# Patient Record
Sex: Female | Born: 1970 | Race: White | Hispanic: No | Marital: Married | Smoking: Never smoker
Health system: Southern US, Community
[De-identification: ages and names within clinical notes are randomized; demographics above are authoritative.]

## PROBLEM LIST (undated history)

## (undated) DIAGNOSIS — N809 Endometriosis, unspecified: Secondary | ICD-10-CM

## (undated) DIAGNOSIS — Z8489 Family history of other specified conditions: Secondary | ICD-10-CM

## (undated) DIAGNOSIS — D369 Benign neoplasm, unspecified site: Secondary | ICD-10-CM

## (undated) HISTORY — PX: WISDOM TOOTH EXTRACTION: SHX21

## (undated) HISTORY — PX: HAND SURGERY: SHX662

## (undated) HISTORY — PX: ABDOMINAL HYSTERECTOMY: SHX81

---

## 2014-07-23 ENCOUNTER — Emergency Department (HOSPITAL_COMMUNITY): Payer: BC Managed Care – PPO

## 2014-07-23 ENCOUNTER — Encounter (HOSPITAL_COMMUNITY): Payer: Self-pay | Admitting: Emergency Medicine

## 2014-07-23 ENCOUNTER — Emergency Department (HOSPITAL_COMMUNITY)
Admission: EM | Admit: 2014-07-23 | Discharge: 2014-07-23 | Disposition: A | Discharge status: Home / Self Care | Payer: BC Managed Care – PPO | Attending: Emergency Medicine | Admitting: Emergency Medicine

## 2014-07-23 DIAGNOSIS — Z86018 Personal history of other benign neoplasm: Secondary | ICD-10-CM | POA: Insufficient documentation

## 2014-07-23 DIAGNOSIS — Z8742 Personal history of other diseases of the female genital tract: Secondary | ICD-10-CM | POA: Insufficient documentation

## 2014-07-23 DIAGNOSIS — Z79899 Other long term (current) drug therapy: Secondary | ICD-10-CM | POA: Diagnosis not present

## 2014-07-23 DIAGNOSIS — R1031 Right lower quadrant pain: Secondary | ICD-10-CM | POA: Diagnosis not present

## 2014-07-23 HISTORY — DX: Benign neoplasm, unspecified site: D36.9

## 2014-07-23 HISTORY — DX: Endometriosis, unspecified: N80.9

## 2014-07-23 LAB — URINALYSIS, ROUTINE W REFLEX MICROSCOPIC
Glucose, UA: NEGATIVE mg/dL
HGB URINE DIPSTICK: NEGATIVE
KETONES UR: 15 mg/dL — AB
Leukocytes, UA: NEGATIVE
Nitrite: NEGATIVE
PROTEIN: NEGATIVE mg/dL
Specific Gravity, Urine: 1.036 — ABNORMAL HIGH (ref 1.005–1.030)
UROBILINOGEN UA: 0.2 mg/dL (ref 0.0–1.0)
pH: 5.5 (ref 5.0–8.0)

## 2014-07-23 LAB — COMPREHENSIVE METABOLIC PANEL
ALBUMIN: 3.8 g/dL (ref 3.5–5.0)
ALK PHOS: 70 U/L (ref 38–126)
ALT: 13 U/L — AB (ref 14–54)
ANION GAP: 7 (ref 5–15)
AST: 15 U/L (ref 15–41)
BUN: 11 mg/dL (ref 6–20)
CALCIUM: 8.8 mg/dL — AB (ref 8.9–10.3)
CO2: 24 mmol/L (ref 22–32)
Chloride: 107 mmol/L (ref 101–111)
Creatinine, Ser: 1 mg/dL (ref 0.44–1.00)
GFR calc Af Amer: >60 mL/min (ref >60)
GFR calc non Af Amer: >60 mL/min (ref >60)
Glucose, Bld: 113 mg/dL — ABNORMAL HIGH (ref 65–99)
Potassium: 3.8 mmol/L (ref 3.5–5.1)
SODIUM: 138 mmol/L (ref 135–145)
TOTAL PROTEIN: 7.4 g/dL (ref 6.5–8.1)
Total Bilirubin: 0.6 mg/dL (ref 0.3–1.2)

## 2014-07-23 LAB — CBC WITH DIFFERENTIAL/PLATELET
Basophils Absolute: 0.1 10*3/uL (ref 0.0–0.1)
Basophils Relative: 1 % (ref 0–1)
EOS ABS: 0.4 10*3/uL (ref 0.0–0.7)
Eosinophils Relative: 4 % (ref 0–5)
HCT: 41.5 % (ref 36.0–46.0)
HEMOGLOBIN: 14.8 g/dL (ref 12.0–15.0)
LYMPHS PCT: 29 % (ref 12–46)
Lymphs Abs: 2.9 10*3/uL (ref 0.7–4.0)
MCH: 31.4 pg (ref 26.0–34.0)
MCHC: 35.7 g/dL (ref 30.0–36.0)
MCV: 87.9 fL (ref 78.0–100.0)
Monocytes Absolute: 0.7 10*3/uL (ref 0.1–1.0)
Monocytes Relative: 7 % (ref 3–12)
NEUTROS PCT: 59 % (ref 43–77)
Neutro Abs: 5.9 10*3/uL (ref 1.7–7.7)
PLATELETS: 285 10*3/uL (ref 150–400)
RBC: 4.72 MIL/uL (ref 3.87–5.11)
RDW: 12.4 % (ref 11.5–15.5)
WBC: 10 10*3/uL (ref 4.0–10.5)

## 2014-07-23 LAB — LIPASE, BLOOD: Lipase: 34 U/L (ref 22–51)

## 2014-07-23 MED ORDER — IOHEXOL 300 MG/ML  SOLN
100.0000 mL | Freq: Once | INTRAMUSCULAR | Status: AC | PRN
  Administered 2014-07-23: 100 mL via INTRAVENOUS

## 2014-07-23 MED ORDER — SODIUM CHLORIDE 0.9 % IV BOLUS (SEPSIS)
1000.0000 mL | Freq: Once | INTRAVENOUS | Status: AC
  Administered 2014-07-23: 1000 mL via INTRAVENOUS

## 2014-07-23 MED ORDER — IOHEXOL 300 MG/ML  SOLN
25.0000 mL | Freq: Once | INTRAMUSCULAR | Status: AC | PRN
  Administered 2014-07-23: 25 mL via ORAL

## 2014-07-23 MED ORDER — TRAMADOL HCL 50 MG PO TABS: 50.0000 mg | ORAL_TABLET | Freq: Four times a day (QID) | ORAL | Status: DC | PRN

## 2014-07-23 MED ORDER — SODIUM CHLORIDE 0.9 % IV BOLUS (SEPSIS): 1000.0000 mL | Freq: Once | INTRAVENOUS | Status: DC

## 2014-07-23 NOTE — ED Notes (Signed)
Dr. Delo at bedside. 

## 2014-07-23 NOTE — ED Notes (Signed)
Pt taken to Ultrasound.

## 2014-07-23 NOTE — ED Provider Notes (Signed)
CSN: [id number]     Arrival date & time 07/23/14  0221 History  This chart was scribed for Veryl Speak, MD by Rayfield Citizen, ED Scribe. This patient was seen in room D32C/D32C and the patient's care was started at 3:16 AM.   Chief Complaint  Patient presents with  . Abdominal Pain   The history is provided by the patient. No language interpreter was used.   HPI Comments: Linda West is an otherwise healthy 44 y.o. female who presents to the Emergency Department complaining of several days of waxing and waning, nonradiating RLQ pain, described as throbbing and burning, worsening acutely around 19:00 on 6/20. Her pain is exacerbated with any form of movement and improves slightly when lying supine and applying pressure. Patient states she took 800mg  motrin at 01:00; she does not want pain medication at this time. She denies dysuria, hematuria, diarrhea, back pain. She denies prior experience with similar symptoms.   She previously had an abdominal hysterectomy but both ovaries remain; patient notes a history of recurrent ovarian cysts.   Past Medical History  Diagnosis Date  . Endometriosis   . Dermoid tumor    Past Surgical History  Procedure Laterality Date  . Abdominal hysterectomy     No family history on file. History  Substance Use Topics  . Smoking status: Never Smoker   . Smokeless tobacco: Not on file  . Alcohol Use: Yes   OB History    No data available     Review of Systems  A complete 10 system review of systems was obtained and all systems are negative except as noted in the HPI and PMH.    Allergies  Codeine and Lodine  Home Medications   Prior to Admission medications   Medication Sig Start Date End Date Taking? Authorizing Provider  cetirizine (ZYRTEC) 10 MG tablet Take 10 mg by mouth daily.   Yes Historical Provider, MD  ibuprofen (ADVIL,MOTRIN) 800 MG tablet Take 800 mg by mouth every 8 (eight) hours as needed for mild pain.   Yes Historical Provider, MD    BP 129/108 mmHg  Pulse 114  Temp(Src) 98.5 F (36.9 C) (Oral)  Resp 14  SpO2 98% Physical Exam  Constitutional: She is oriented to person, place, and time. She appears well-developed and well-nourished.  HENT:  Head: Normocephalic and atraumatic.  Neck: No tracheal deviation present.  Cardiovascular: Normal rate, regular rhythm and normal heart sounds.  Exam reveals no gallop and no friction rub.   No murmur heard. Pulmonary/Chest: Effort normal. No respiratory distress. She has no wheezes. She has no rales.  Abdominal: Soft. There is tenderness (Tender to palpation RLQ). There is no rebound and no guarding.  Musculoskeletal: Normal range of motion. She exhibits no edema.  Neurological: She is alert and oriented to person, place, and time.  Skin: Skin is warm and dry.  Psychiatric: She has a normal mood and affect. Her behavior is normal.  Nursing note and vitals reviewed.   ED Course  Procedures   DIAGNOSTIC STUDIES: Oxygen Saturation is 98% on RA, normal by my interpretation.    COORDINATION OF CARE: 3:23 AM Discussed treatment plan with pt at bedside and pt agreed to plan.   Labs Review Labs Reviewed  URINALYSIS, ROUTINE W REFLEX MICROSCOPIC (NOT AT [facility]) - Abnormal; Notable for the following:    Specific Gravity, Urine 1.036 (*)    Bilirubin Urine SMALL (*)    Ketones, ur 15 (*)    All other components within  normal limits  CBC WITH DIFFERENTIAL/PLATELET  COMPREHENSIVE METABOLIC PANEL  LIPASE, BLOOD    Imaging Review No results found.   EKG Interpretation None      MDM   Final diagnoses:  None    Patient presents with complaints of right lower quadrant pain that has worsened over the past several days. She is tender in the right lower quadrant but no rebound and no guarding. Her workup reveals no elevation of white count, clear urinalysis, and CT and ultrasound that revealed no acute intra-abdominal pathology. Her pain is worse when she moves and sits  forward, and completely resolves when she lies still. It almost seems somewhat musculoskeletal in nature. She will be discharged with ibuprofen and tramadol and when necessary return.   I personally performed the services described in this documentation, which was scribed in my presence. The recorded information has been reviewed and is accurate.      Veryl Speak, MD 07/23/14 [phone]

## 2014-07-23 NOTE — ED Notes (Signed)
Pt. \reports RLQ pain onset Wednesday with nausea and loose stools .

## 2014-07-23 NOTE — Discharge Instructions (Signed)
Ibuprofen 600 mg every 6 hours as needed, and tramadol as prescribed as needed for pain.  Return to the emergency department for worsening pain, high fever, bloody stool, or other new and concerning symptoms.   Abdominal Pain Many things can cause abdominal pain. Usually, abdominal pain is not caused by a disease and will improve without treatment. It can often be observed and treated at home. Your health care provider will do a physical exam and possibly order blood tests and X-rays to help determine the seriousness of your pain. However, in many cases, more time must pass before a clear cause of the pain can be found. Before that point, your health care provider may not know if you need more testing or further treatment. HOME CARE INSTRUCTIONS  Monitor your abdominal pain for any changes. The following actions may help to alleviate any discomfort you are experiencing:  Only take over-the-counter or prescription medicines as directed by your health care provider.  Do not take laxatives unless directed to do so by your health care provider.  Try a clear liquid diet (broth, tea, or water) as directed by your health care provider. Slowly move to a bland diet as tolerated. SEEK MEDICAL CARE IF:  You have unexplained abdominal pain.  You have abdominal pain associated with nausea or diarrhea.  You have pain when you urinate or have a bowel movement.  You experience abdominal pain that wakes you in the night.  You have abdominal pain that is worsened or improved by eating food.  You have abdominal pain that is worsened with eating fatty foods.  You have a fever. SEEK IMMEDIATE MEDICAL CARE IF:   Your pain does not go away within 2 hours.  You keep throwing up (vomiting).  Your pain is felt only in portions of the abdomen, such as the right side or the left lower portion of the abdomen.  You pass bloody or black tarry stools. MAKE SURE YOU:  Understand these instructions.   Will  watch your condition.   Will get help right away if you are not doing well or get worse.  Document Released: 10/28/2004 Document Revised: 01/23/2013 Document Reviewed: 09/27/2012 [facility] Patient Information 2015 [place], [place]. This information is not intended to replace advice given to you by your health care provider. Make sure you discuss any questions you have with your health care provider.

## 2014-07-23 NOTE — ED Notes (Signed)
Discharge instructions and prescription reviewed, voiced understanding. 

## 2015-09-11 NOTE — Progress Notes (Signed)
Cardiology Office Note   Date:  09/12/2015   ID:  Linda West, DOB [birth year], MRN [id number]  PCP:  Doree Albee, MD  Cardiologist:   Minus Breeding, MD   Chief Complaint  Patient presents with  . Abnormal Echo      History of Present Illness: Linda West is a 45 y.o. female who presents for evaluation of an abnormal echo. She had a recent screening study recently and was found to have T-wave inversion on EKG. She subsequently had an echocardiogram which demonstrated an ejection fraction was slightly low at 46% with questionable septal and lateral wall hypokinesis. She also had a cardiopulmonary stress test.  The patient has not had prior cardiac problems. She has had a lot of stress in her life with her oldest son being ill and another child with autism. 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 She says she will get palpitations sometimes at night. She describes her heart is beating fast for several minutes at a time. This may happen occasionally during the day as well. She does not describe chest pressure, neck or arm discomfort. She does get more fatigued than she used to. She doesn't have any new shortness of breath, PND or orthopnea. She has no weight gain or edema. She does have problems with insomnia.   Past Medical History:  Diagnosis Date  . Dermoid tumor   . Endometriosis     Past Surgical History:  Procedure Laterality Date  . ABDOMINAL HYSTERECTOMY       Current Outpatient Prescriptions  Medication Sig Dispense Refill  . cetirizine (ZYRTEC) 10 MG tablet Take 10 mg by mouth daily.    . Cholecalciferol (VITAMIN D3 PO) Take 7,000 Units by mouth.    . clonazePAM (KLONOPIN) 0.5 MG tablet Take 0.5 mg by mouth 2 (two) times daily as needed for anxiety.    Marland Kitchen ibuprofen (ADVIL,MOTRIN) 800 MG tablet Take 800 mg by mouth every 8 (eight) hours as needed for mild pain.    . Melatonin 1 MG TABS Take by mouth daily.    . sertraline  (ZOLOFT) 50 MG tablet Take 50 mg by mouth daily.    Marland Kitchen UNABLE TO FIND daily. Med Name: testosterone 1% 0.5 ML cream     No current facility-administered medications for this visit.     Allergies:   Codeine and Lodine [etodolac]    Social History:  The patient  reports that she has never smoked. She has never used smokeless tobacco. She reports that she drinks alcohol. She reports that she does not use drugs.   Family History:  The patient's family history includes Heart attack in her maternal uncle and paternal uncle; Heart attack (age of onset: 42) in her maternal grandfather; Heart disease in her maternal uncle; Heart failure (age of onset: 81) in her paternal grandmother; Heart murmur in her maternal grandmother; Hypertension in her brother.    ROS:  Please see the history of present illness.   Otherwise, review of systems are positive for none.   All other systems are reviewed and negative.    PHYSICAL EXAM: VS:  BP (!) 128/100   Pulse 96   Ht 5\' 8"  (1.727 m)   Wt 162 lb (73.5 kg)   SpO2 98%   BMI 24.63 kg/m  , BMI Body mass index is 24.63 kg/m. GENERAL:  Well appearing HEENT:  Pupils equal round and reactive, fundi not visualized, oral mucosa unremarkable NECK:  No jugular venous distention, waveform within normal limits, carotid upstroke brisk and symmetric, no bruits, no thyromegaly LYMPHATICS:  No cervical, inguinal adenopathy LUNGS:  Clear to auscultation bilaterally BACK:  No CVA tenderness CHEST:  Unremarkable HEART:  PMI not displaced or sustained,S1 and S2 within normal limits, no S3, no S4, no clicks, no rubs, no murmurs ABD:  Flat, positive bowel sounds normal in frequency in pitch, no bruits, no rebound, no guarding, no midline pulsatile mass, no hepatomegaly, no splenomegaly EXT:  2 plus pulses throughout, no edema, no cyanosis no clubbing SKIN:  No rashes no nodules NEURO:  Cranial nerves II through XII grossly intact, motor grossly intact throughout PSYCH:   Cognitively intact, oriented to person place and time    EKG:  EKG is not ordered today. The ekg ordered today demonstrates sinus rhythm, rate 65, axis within normal limits, RSR prime V1 and V2, diffuse lateral T-wave inversions.   Recent Labs: No results found for requested labs within last 8760 hours.    Lipid Panel No results found for: CHOL, TRIG, HDL, CHOLHDL, VLDL, LDLCALC, LDLDIRECT    Wt Readings from Last 3 Encounters:  09/12/15 162 lb (73.5 kg)      Other studies Reviewed: Additional studies/ records that were reviewed today include: Office records, echo and CXP results. Review of the above records demonstrates:  Please see elsewhere in the note.     ASSESSMENT AND PLAN:  ABNORMAL ECHOCARDIOGRAM:  I would like to review the echo images. Further evaluation of this result will be based on that review.  PALPITATIONS:   Linda West will need a 21 day event monitor.  The patients symptoms necessitate an event monitor.  The symptoms are too infrequent to be identified on a Holter monitor.   I do not see a TSH but I will defer to Doree Albee, MD  ABNORMAL EKG:  I don't strongly suspect obstructive coronary disease or structural heart disease but would like to review the echo and then consider further evaluation such as stress testing or other imaging.  STRESS/INSOMNIA:  We had a long discussion about this and its contribution potentially her symptoms.   Current medicines are reviewed at length with the patient today.  The patient does not have concerns regarding medicines.  The following changes have been made:  no change  Labs/ tests ordered today include:   Orders Placed This Encounter  Procedures  . Cardiac event monitor     Disposition:   FU with me in one month.     Signed, Minus Breeding, MD  09/12/2015 5:25 PM    [facility]

## 2015-09-12 ENCOUNTER — Encounter: Payer: Self-pay | Admitting: *Deleted

## 2015-09-12 ENCOUNTER — Encounter: Payer: Self-pay | Admitting: Cardiology

## 2015-09-12 ENCOUNTER — Ambulatory Visit (INDEPENDENT_AMBULATORY_CARE_PROVIDER_SITE_OTHER): Payer: BC Managed Care – PPO | Admitting: Cardiology

## 2015-09-12 VITALS — BP 128/100 | HR 96 | Ht 68.0 in | Wt 162.0 lb

## 2015-09-12 DIAGNOSIS — R002 Palpitations: Secondary | ICD-10-CM

## 2015-09-12 DIAGNOSIS — R9431 Abnormal electrocardiogram [ECG] [EKG]: Secondary | ICD-10-CM | POA: Diagnosis not present

## 2015-09-12 DIAGNOSIS — R931 Abnormal findings on diagnostic imaging of heart and coronary circulation: Secondary | ICD-10-CM | POA: Diagnosis not present

## 2015-09-12 NOTE — Patient Instructions (Addendum)
Medication Instructions:   Your physician recommends that you continue on your current medications as directed. Please refer to the Current Medication list given to you today.  Labwork: NONE  Testing/Procedures: Your physician has recommended that you wear an event monitor for 21 days. Event monitors are medical devices that record the heart's electrical activity. Doctors most often [place] these monitors to diagnose arrhythmias. Arrhythmias are problems with the speed or rhythm of the heartbeat. The monitor is a small, portable device. You can wear one while you do your normal daily activities. This is usually used to diagnose what is causing palpitations/syncope (passing out). [facility] will send this monitor to you.  Follow-Up:  Your physician recommends that you schedule a follow-up appointment in: 1 month with Dr. Percival Spanish at the [place] office.  Any Other Special Instructions Will Be Listed Below (If Applicable).  If you need a refill on your cardiac medications before your next appointment, please call your pharmacy.

## 2015-09-19 ENCOUNTER — Ambulatory Visit (INDEPENDENT_AMBULATORY_CARE_PROVIDER_SITE_OTHER): Payer: BC Managed Care – PPO

## 2015-09-19 DIAGNOSIS — R002 Palpitations: Secondary | ICD-10-CM

## 2015-09-30 ENCOUNTER — Encounter: Payer: Self-pay | Admitting: Cardiology

## 2015-10-14 NOTE — Progress Notes (Signed)
Cardiology Office Note   Date:  10/15/2015   ID:  Linda West, DOB [birth year], MRN [id number]  PCP:  Doree Albee, MD  Cardiologist:   Minus Breeding, MD   Chief Complaint  Patient presents with  . Palpitations      History of Present Illness: Linda West is a 45 y.o. female who presents for evaluation of an abnormal echo. She had a recent screening study recently and was found to have T-wave inversion on EKG. She subsequently had an echocardiogram which demonstrated an ejection fraction was slightly low at 46% with questionable septal and lateral wall hypokinesis. She also had a cardiopulmonary stress test.  At the last visit she was having palpitations.  However, an event monitor demonstrated normal sinus rhythm with only very rare ectopy. She returns for follow-up.  She's actually been feeling very well. She was having a lot of stress. However, one of her children moved out of the house and has removed quite a bit of stress. She's not having palpitations she was having before. She is exercising she denies any cardiovascular symptoms. She's not been having any chest pressure, neck or arm discomfort. She's not having any shortness of breath, PND or orthopnea. She still has some problems with insomnia.  Past Medical History:  Diagnosis Date  . Dermoid tumor   . Endometriosis     Past Surgical History:  Procedure Laterality Date  . ABDOMINAL HYSTERECTOMY       Current Outpatient Prescriptions  Medication Sig Dispense Refill  . cetirizine (ZYRTEC) 10 MG tablet Take 10 mg by mouth daily.    . Cholecalciferol (VITAMIN D3 PO) Take 7,000 Units by mouth.    . clonazePAM (KLONOPIN) 0.5 MG tablet Take 0.5 mg by mouth at bedtime.     Marland Kitchen ibuprofen (ADVIL,MOTRIN) 800 MG tablet Take 800 mg by mouth every 8 (eight) hours as needed for mild pain.    . Melatonin 1 MG TABS Take by mouth daily.    . sertraline (ZOLOFT) 50 MG tablet Take 50 mg by mouth daily.     No current  facility-administered medications for this visit.     Allergies:   Codeine and Lodine [etodolac]    ROS:  Please see the history of present illness.   Otherwise, review of systems are positive for none.   All other systems are reviewed and negative.    PHYSICAL EXAM: VS:  BP 132/86   Pulse 76   Ht 5' 7.5" (1.715 m)   Wt 164 lb (74.4 kg)   BMI 25.31 kg/m  , BMI Body mass index is 25.31 kg/m. GENERAL:  Well appearing HEENT:  Pupils equal round and reactive, fundi not visualized, oral mucosa unremarkable NECK:  No jugular venous distention, waveform within normal limits, carotid upstroke brisk and symmetric, no bruits, no thyromegaly LYMPHATICS:  No cervical, inguinal adenopathy LUNGS:  Clear to auscultation bilaterally BACK:  No CVA tenderness CHEST:  Unremarkable HEART:  PMI not displaced or sustained,S1 and S2 within normal limits, no S3, no S4, no clicks, no rubs, no murmurs ABD:  Flat, positive bowel sounds normal in frequency in pitch, no bruits, no rebound, no guarding, no midline pulsatile mass, no hepatomegaly, no splenomegaly EXT:  2 plus pulses throughout, no edema, no cyanosis no clubbing SKIN:  No rashes no nodules NEURO:  Cranial nerves II through XII grossly intact, motor grossly intact throughout PSYCH:  Cognitively intact, oriented to person place and time    EKG:  EKG is  ordered today. The ekg ordered today demonstrates sinus rhythm, rate 65, axis within normal limits, RSR prime V1 and V2, T-wave inversion in the anterior leads V1 through V3 much improved compared with previous.   Recent Labs: No results found for requested labs within last 8760 hours.    Lipid Panel No results found for: CHOL, TRIG, HDL, CHOLHDL, VLDL, LDLCALC, LDLDIRECT    Wt Readings from Last 3 Encounters:  10/15/15 164 lb (74.4 kg)  09/12/15 162 lb (73.5 kg)      Other studies Reviewed: Additional studies/ records that were reviewed today include: None Review of the above  records demonstrates:       ASSESSMENT AND PLAN:  ABNORMAL ECHOCARDIOGRAM:  She brought the echo today but I was unable to load this and will look for machine that will allow me to review this. She has no symptoms at this point and the EKG is improved. It is unlikely that further testing will be needed.  PALPITATIONS:   Event monitor demonstrated no arrhythmias. She's not having any palpitations. No change in therapy is indicated. No further testing for dysrhythmia is indicated.  ABNORMAL EKG:  This is improved. As above I will review the echo.  STRESS/INSOMNIA:  We had a long discussion about this and its contribution potentially her symptoms.   Current medicines are reviewed at length with the patient today.  The patient does not have concerns regarding medicines.  The following changes have been made:  none  Labs/ tests ordered today include:   none  Orders Placed This Encounter  Procedures  . EKG 12-Lead     Disposition:   FU with me as needed.  Ronnell Guadalajara, MD  10/15/2015 6:40 PM    [place]

## 2015-10-15 ENCOUNTER — Encounter: Payer: Self-pay | Admitting: Cardiology

## 2015-10-15 ENCOUNTER — Ambulatory Visit (INDEPENDENT_AMBULATORY_CARE_PROVIDER_SITE_OTHER): Payer: BC Managed Care – PPO | Admitting: Cardiology

## 2015-10-15 VITALS — BP 132/86 | HR 76 | Ht 67.5 in | Wt 164.0 lb

## 2015-10-15 DIAGNOSIS — R002 Palpitations: Secondary | ICD-10-CM | POA: Diagnosis not present

## 2015-10-15 NOTE — Patient Instructions (Signed)
Medication Instructions:  The current medical regimen is effective;  continue present plan and medications.  Follow-Up: Follow up as needed with Dr Hochrein.  Thank you for choosing [facility]!!     

## 2015-11-04 ENCOUNTER — Telehealth: Payer: Self-pay | Admitting: Cardiology

## 2015-11-04 NOTE — Telephone Encounter (Signed)
Patient is calling to see if Dr. Percival Spanish has read the results of a recent echo.

## 2015-11-05 NOTE — Telephone Encounter (Signed)
Patient last seen in [place] on 10/15/2015 by Dr. Percival Spanish.  Dictation from that day states that he did not have device to read CD on while there in the office.

## 2015-11-06 NOTE — Telephone Encounter (Signed)
Spoke to patient's husband he stated wife wanted to know if Dr.Hochrein has read echo.Advised Dr.Hochrein out of office.I will send message to him.

## 2015-11-11 NOTE — Telephone Encounter (Signed)
Follow Up:    Calling to see if Dr Warren Lacy have read her ECG yet? Pt is a [facility] pt,Pam is not here today.

## 2015-11-11 NOTE — Telephone Encounter (Signed)
ECG performed dated 9/14 - day after pt seen by Dr. Percival Spanish. This is scanned in to ECG's in chart.  Shows NSR w nonspecific T wave abnormality.  Routed to Dr. Percival Spanish for any recommendations.

## 2015-12-04 NOTE — Telephone Encounter (Signed)
We have tried to load this disc and we cannot get the image to load.  Can you please tell the patient that we are still working on it.  We will try to get a different copy and please work with the previous MD to try to obtain a copy that works.

## 2015-12-05 NOTE — Telephone Encounter (Signed)
Spoke to patient. Aware of delay and issue with CD. I have informed her I would attempt to reach out to Dr. Lanice Shirts office to get this resent. She wanted to make sure they send it directly to [place] and not to her. I will relay this request.  Dr. Lanice Shirts office closed today. Will attempt Monday. Phone is [phone]

## 2015-12-08 NOTE — Telephone Encounter (Signed)
Contacted Dr. Lanice Shirts office and spoke to RN who voiced understanding of request. I confirmed our address that it could be sent to and asked that she call if she needs anything further from [place].

## 2015-12-22 ENCOUNTER — Telehealth: Payer: Self-pay | Admitting: Cardiology

## 2015-12-22 NOTE — Telephone Encounter (Signed)
Returned call - advised should be OK to drop off w [place] office and can have courier pick up.

## 2015-12-22 NOTE — Telephone Encounter (Signed)
New message       Calling to ask Dr Percival Spanish if they can drop off the echo CD to the [place] office and the courier bring it to Dr Percival Spanish or will they need to send it directly to [facility]?

## 2015-12-24 ENCOUNTER — Emergency Department (HOSPITAL_COMMUNITY)
Admission: EM | Admit: 2015-12-24 | Discharge: 2015-12-24 | Disposition: A | Discharge status: Home / Self Care | Payer: BC Managed Care – PPO | Attending: Emergency Medicine | Admitting: Emergency Medicine

## 2015-12-24 ENCOUNTER — Encounter (HOSPITAL_COMMUNITY): Payer: Self-pay | Admitting: Emergency Medicine

## 2015-12-24 ENCOUNTER — Emergency Department (HOSPITAL_COMMUNITY): Payer: BC Managed Care – PPO

## 2015-12-24 DIAGNOSIS — W25XXXA Contact with sharp glass, initial encounter: Secondary | ICD-10-CM | POA: Insufficient documentation

## 2015-12-24 DIAGNOSIS — Y929 Unspecified place or not applicable: Secondary | ICD-10-CM | POA: Insufficient documentation

## 2015-12-24 DIAGNOSIS — Y939 Activity, unspecified: Secondary | ICD-10-CM | POA: Diagnosis not present

## 2015-12-24 DIAGNOSIS — S61212A Laceration without foreign body of right middle finger without damage to nail, initial encounter: Secondary | ICD-10-CM | POA: Diagnosis present

## 2015-12-24 DIAGNOSIS — T07XXXA Unspecified multiple injuries, initial encounter: Secondary | ICD-10-CM

## 2015-12-24 DIAGNOSIS — Y999 Unspecified external cause status: Secondary | ICD-10-CM | POA: Diagnosis not present

## 2015-12-24 MED ORDER — LIDOCAINE HCL (PF) 1 % IJ SOLN
10.0000 mL | Freq: Once | INTRAMUSCULAR | Status: AC
  Administered 2015-12-24: 10 mL via INTRADERMAL
  Filled 2015-12-24: qty 10

## 2015-12-24 MED ORDER — HYDROCODONE-ACETAMINOPHEN 5-325 MG PO TABS: 1.0000 | ORAL_TABLET | ORAL | 0 refills | Status: AC | PRN

## 2015-12-24 MED ORDER — HYDROCODONE-ACETAMINOPHEN 5-325 MG PO TABS
2.0000 | ORAL_TABLET | Freq: Once | ORAL | Status: AC
  Administered 2015-12-24: 2 via ORAL
  Filled 2015-12-24: qty 2

## 2015-12-24 MED ORDER — TETANUS-DIPHTH-ACELL PERTUSSIS 5-2.5-18.5 LF-MCG/0.5 IM SUSP
0.5000 mL | Freq: Once | INTRAMUSCULAR | Status: AC
  Administered 2015-12-24: 0.5 mL via INTRAMUSCULAR
  Filled 2015-12-24: qty 0.5

## 2015-12-24 MED ORDER — CEPHALEXIN 500 MG PO CAPS: 500.0000 mg | ORAL_CAPSULE | Freq: Three times a day (TID) | ORAL | 0 refills | Status: AC

## 2015-12-24 NOTE — ED Triage Notes (Addendum)
Pt cut right hand on glass bowl at home while cleaning. Pt cut middle finger and palm. Pt had syncopal episode in triage in chair due to pain.

## 2015-12-24 NOTE — ED Provider Notes (Addendum)
[facility] DEPT Provider Note   CSN: [id number] Arrival date & time: 12/24/15  1040     History   Chief Complaint Chief Complaint  Patient presents with  . Hand Injury    HPI Linda West is a 45 y.o. female.  HPI  45 year old right-hand dominant female who presents with laceration to the base of her long finger. Patient was using a glass bowl today when it shattered. It broke into several pieces, one of which cut her middle finger. She reports large volume of reportedly pulsatile bleeding. She also endorses mild numbness distal to the wound along the ulnar aspect of her finger. Denies any difficulty moving it, although she has severe, sharp, stabbing pain. Pain is made worse with movement. Denies any alleviating factors. Last tetanus unknown.  Past Medical History:  Diagnosis Date  . Dermoid tumor   . Endometriosis     There are no active problems to display for this patient.   Past Surgical History:  Procedure Laterality Date  . ABDOMINAL HYSTERECTOMY      OB History    No data available       Home Medications    Prior to Admission medications   Medication Sig Start Date End Date Taking? Authorizing Provider  cetirizine (ZYRTEC) 10 MG tablet Take 10 mg by mouth daily.   Yes Historical Provider, MD  Cholecalciferol (VITAMIN D3 PO) Take 7,000 Units by mouth.   Yes Historical Provider, MD  clonazePAM (KLONOPIN) 0.5 MG disintegrating tablet Take 0.5 mg by mouth at bedtime as needed for anxiety. 11/03/15  Yes Historical Provider, MD  ibuprofen (ADVIL,MOTRIN) 800 MG tablet Take 800 mg by mouth every 8 (eight) hours as needed for mild pain.   Yes Historical Provider, MD  Melatonin 1 MG TABS Take by mouth daily.   Yes Historical Provider, MD  sertraline (ZOLOFT) 100 MG tablet Take 100 mg by mouth daily.   Yes Historical Provider, MD  sertraline (ZOLOFT) 50 MG tablet Take 50 mg by mouth daily.   Yes Historical Provider, MD  cephALEXin (KEFLEX) 500 MG capsule Take 1  capsule (500 mg total) by mouth 3 (three) times daily. 12/24/15 12/31/15  Duffy Bruce, MD  clonazePAM (KLONOPIN) 0.5 MG tablet Take 0.5 mg by mouth at bedtime.     Historical Provider, MD  HYDROcodone-acetaminophen (NORCO/VICODIN) 5-325 MG tablet Take 1-2 tablets by mouth every 4 (four) hours as needed for moderate pain or severe pain. 12/24/15   Duffy Bruce, MD    Family History Family History  Problem Relation Age of Onset  . Hypertension Brother   . Heart attack Maternal Uncle   . Heart disease Maternal Uncle   . Heart attack Paternal Uncle   . Heart murmur Maternal Grandmother   . Heart attack Maternal Grandfather 89  . Heart failure Paternal Grandmother 64    Social History Social History  Substance Use Topics  . Smoking status: Never Smoker  . Smokeless tobacco: Never Used  . Alcohol use Yes     Allergies   Codeine and Lodine [etodolac]   Review of Systems Review of Systems  Constitutional: Negative for chills and fever.  Respiratory: Negative for shortness of breath.   Cardiovascular: Negative for chest pain.  Musculoskeletal: Negative for neck pain.  Skin: Negative for rash and wound.  Allergic/Immunologic: Negative for immunocompromised state.  Neurological: Negative for weakness and numbness.  Hematological: Does not bruise/bleed easily.     Physical Exam Updated Vital Signs BP 139/84   Pulse 70  Temp 98.2 F (36.8 C) (Oral)   Resp 16   SpO2 97%   Physical Exam  Constitutional: She is oriented to person, place, and time. She appears well-developed and well-nourished. No distress.  HENT:  Head: Normocephalic and atraumatic.  Eyes: Conjunctivae are normal.  Neck: Neck supple.  Cardiovascular: Normal rate, regular rhythm and normal heart sounds.   Pulmonary/Chest: Effort normal. No respiratory distress. She has no wheezes.  Abdominal: She exhibits no distension.  Musculoskeletal: She exhibits no edema.  Neurological: She is alert and  oriented to person, place, and time. She exhibits normal muscle tone.  Skin: Skin is warm. Capillary refill takes less than 2 seconds. No rash noted.  Nursing note and vitals reviewed.   UPPER EXTREMITY EXAM: RIGHT  INSPECTION & PALPATION: Approx 1.5 cm linear laceration to ulnar aspect of palmar and side of middle finger, with penetration to deep fatty tissue but no exposed bone or joint.  SENSORY: Sensation is intact to light touch in:  Superficial radial nerve distribution (dorsal first web space) Median nerve distribution (tip of index finger)   Ulnar nerve distribution (tip of small finger)    Sensation mildly decreased along ulnar distribution of middle finger distal to injury  MOTOR:  + Motor posterior interosseous nerve (thumb IP extension) + Anterior interosseous nerve (thumb IP flexion, index finger DIP flexion) + Radial nerve (wrist extension) + Median nerve (palpable firing thenar mass) + Ulnar nerve (palpable firing of first dorsal interosseous muscle)  VASCULAR: 2+ radial pulse Brisk capillary refill < 2 sec, fingers warm and well-perfused Good cap refill in middle finger   ED Treatments / Results  Labs (all labs ordered are listed, but only abnormal results are displayed) Labs Reviewed - No data to display  EKG  EKG Interpretation None       Radiology Dg Hand Complete Left  Result Date: 12/24/2015 CLINICAL DATA:  Cut middle finger on left hand. EXAM: LEFT HAND - COMPLETE 3+ VIEW COMPARISON:  None. FINDINGS: Anterior projection radiograph is degraded secondary to overlying pulse oximetry device. No fracture or dislocation with special attention paid to the middle digit. Joint spaces are preserved. No erosions. Regional soft tissues appear normal. No radiopaque foreign body. IMPRESSION: No radiopaque foreign body. Electronically Signed   By: Sandi Mariscal M.D.   On: 12/24/2015 11:53   Dg Hand Complete Right  Result Date: 12/24/2015 CLINICAL DATA:  Cut hand  on glass bowl EXAM: RIGHT HAND - COMPLETE 3+ VIEW COMPARISON:  None. FINDINGS: Frontal, oblique, and lateral views were obtained. There is no fracture or dislocation. Joint spaces appear unremarkable. No erosive change. No radiopaque foreign body. There is a bone island in the capitate bone. IMPRESSION: No fracture or dislocation. No radiopaque foreign body. No appreciable arthropathy. Bone island in the capitate bone. Electronically Signed   By: Lowella Grip III M.D.   On: 12/24/2015 11:53    Procedures .Marland KitchenLaceration Repair Date/Time: 12/25/2015 9:52 AM Performed by: Duffy Bruce Authorized by: Duffy Bruce   Consent:    Consent obtained:  Verbal   Consent given by:  Patient   Risks discussed:  Infection, need for additional repair, nerve damage, poor wound healing, poor cosmetic result, pain, retained foreign body and tendon damage   Alternatives discussed:  No treatment and delayed treatment Anesthesia (see MAR for exact dosages):    Anesthesia method:  Nerve block   Block location:  Digital   Block needle gauge:  27 G   Block anesthetic:  Lidocaine 1%  w/o epi   Block injection procedure:  Anatomic landmarks identified, anatomic landmarks palpated, negative aspiration for blood, introduced needle and incremental injection   Block outcome:  Anesthesia achieved Laceration details:    Location:  Finger   Finger location:  R long finger   Length (cm):  1.5 Repair type:    Repair type:  Simple Pre-procedure details:    Preparation:  Patient was prepped and draped in usual sterile fashion and imaging obtained to evaluate for foreign bodies Exploration:    Hemostasis achieved with:  Direct pressure and tourniquet   Wound exploration: wound explored through full range of motion and entire depth of wound probed and visualized     Wound extent: no fascia violation noted, no muscle damage noted and no tendon damage noted     Contaminated: no   Treatment:    Area cleansed with:   Betadine   Amount of cleaning:  Extensive   Irrigation solution:  Sterile saline   Irrigation volume:  500   Irrigation method:  Pressure wash   Visualized foreign bodies/material removed: no   Skin repair:    Repair method:  Sutures   Suture size:  4-0   Suture material:  Prolene   Suture technique:  Simple interrupted   Number of sutures:  5 Approximation:    Approximation:  Close   Vermilion border: well-aligned   Post-procedure details:    Dressing:  Antibiotic ointment and splint for protection   Patient tolerance of procedure:  Tolerated well, no immediate complications   (including critical care time)  Medications Ordered in ED Medications  HYDROcodone-acetaminophen (NORCO/VICODIN) 5-325 MG per tablet 2 tablet (2 tablets Oral Given 12/24/15 1204)  lidocaine (PF) (XYLOCAINE) 1 % injection 10 mL (10 mLs Intradermal Given 12/24/15 1203)  Tdap (BOOSTRIX) injection 0.5 mL (0.5 mLs Intramuscular Given 12/24/15 1202)     Initial Impression / Assessment and Plan / ED Course  I have reviewed the triage vital signs and the nursing notes.  Pertinent labs & imaging results that were available during my care of the patient were reviewed by me and considered in my medical decision making (see chart for details).  Clinical Course     45 year old right-hand dominant female who presents with laceration to the ulnar aspect of her proximal phalanx of her right middle finger. She does have mild decreased sensation along the ulnar aspect distal this, concerning for digital ulnar nerve injury. She reports pulsatile bleeding is well but distal cap refill is fully intact, radial digital artery is palpable, and I do not suspect vascular compromise. Laceration is lateral to tendon sheaths and she has full ROM at MCP, PIP, and DIP and I do not suspect significant tendon injury or laceration. Laceration closed and repaired as above. Will place the patient on prophylactic Keflex, finger splint, and  refer for outpatient follow-up. Return precautions given.  Final Clinical Impressions(s) / ED Diagnoses   Final diagnoses:  Laceration of right middle finger without foreign body without damage to nail, initial encounter  Abrasions of multiple sites    New Prescriptions Discharge Medication List as of 12/24/2015  1:41 PM    START taking these medications   Details  cephALEXin (KEFLEX) 500 MG capsule Take 1 capsule (500 mg total) by mouth 3 (three) times daily., Starting Wed 12/24/2015, Until Wed 12/31/2015, Print    HYDROcodone-acetaminophen (NORCO/VICODIN) 5-325 MG tablet Take 1-2 tablets by mouth every 4 (four) hours as needed for moderate pain or severe pain., Starting Wed  12/24/2015, Print         Duffy Bruce, MD 12/25/15 [place], MD 12/25/15 [phone]

## 2015-12-24 NOTE — ED Notes (Signed)
Patient transported to X-ray 

## 2016-01-14 ENCOUNTER — Other Ambulatory Visit: Payer: Self-pay | Admitting: Orthopedic Surgery

## 2016-01-15 ENCOUNTER — Encounter (HOSPITAL_BASED_OUTPATIENT_CLINIC_OR_DEPARTMENT_OTHER): Payer: Self-pay | Admitting: Anesthesiology

## 2016-01-15 ENCOUNTER — Encounter (HOSPITAL_BASED_OUTPATIENT_CLINIC_OR_DEPARTMENT_OTHER)
Admission: RE | Disposition: A | Discharge status: Home / Self Care | Payer: Self-pay | Source: Ambulatory Visit | Attending: Orthopedic Surgery

## 2016-01-15 ENCOUNTER — Ambulatory Visit (HOSPITAL_BASED_OUTPATIENT_CLINIC_OR_DEPARTMENT_OTHER): Payer: BC Managed Care – PPO | Admitting: Anesthesiology

## 2016-01-15 ENCOUNTER — Ambulatory Visit (HOSPITAL_BASED_OUTPATIENT_CLINIC_OR_DEPARTMENT_OTHER)
Admission: RE | Admit: 2016-01-15 | Discharge: 2016-01-15 | Disposition: A | Discharge status: Home / Self Care | Payer: BC Managed Care – PPO | Source: Ambulatory Visit | Attending: Orthopedic Surgery | Admitting: Orthopedic Surgery

## 2016-01-15 DIAGNOSIS — F419 Anxiety disorder, unspecified: Secondary | ICD-10-CM | POA: Insufficient documentation

## 2016-01-15 DIAGNOSIS — S64492A Injury of digital nerve of right middle finger, initial encounter: Secondary | ICD-10-CM | POA: Diagnosis not present

## 2016-01-15 DIAGNOSIS — W269XXA Contact with unspecified sharp object(s), initial encounter: Secondary | ICD-10-CM | POA: Diagnosis not present

## 2016-01-15 DIAGNOSIS — S65512A Laceration of blood vessel of right middle finger, initial encounter: Secondary | ICD-10-CM | POA: Insufficient documentation

## 2016-01-15 DIAGNOSIS — S66122A Laceration of flexor muscle, fascia and tendon of right middle finger at wrist and hand level, initial encounter: Secondary | ICD-10-CM | POA: Diagnosis not present

## 2016-01-15 HISTORY — PX: NERVE, TENDON AND ARTERY REPAIR: SHX5695

## 2016-01-15 SURGERY — NERVE, TENDON AND ARTERY REPAIR
Anesthesia: General | Site: Hand | Laterality: Right

## 2016-01-15 MED ORDER — ACETAMINOPHEN 500 MG PO TABS: 1000.0000 mg | ORAL_TABLET | Freq: Once | ORAL | Status: DC

## 2016-01-15 MED ORDER — ONDANSETRON HCL 4 MG/2ML IJ SOLN
INTRAMUSCULAR | Status: AC
  Filled 2016-01-15: qty 2

## 2016-01-15 MED ORDER — ONDANSETRON HCL 4 MG PO TABS: 4.0000 mg | ORAL_TABLET | Freq: Three times a day (TID) | ORAL | 0 refills | Status: AC | PRN

## 2016-01-15 MED ORDER — LIDOCAINE 2% (20 MG/ML) 5 ML SYRINGE
INTRAMUSCULAR | Status: AC
  Filled 2016-01-15: qty 5

## 2016-01-15 MED ORDER — MIDAZOLAM HCL 2 MG/2ML IJ SOLN
INTRAMUSCULAR | Status: AC
  Filled 2016-01-15: qty 2

## 2016-01-15 MED ORDER — PROMETHAZINE HCL 25 MG/ML IJ SOLN: 6.2500 mg | INTRAMUSCULAR | Status: DC | PRN

## 2016-01-15 MED ORDER — LACTATED RINGERS IV SOLN
INTRAVENOUS | Status: DC
  Administered 2016-01-15 (×2): via INTRAVENOUS

## 2016-01-15 MED ORDER — ACETAMINOPHEN 325 MG PO TABS
ORAL_TABLET | ORAL | Status: AC
  Filled 2016-01-15: qty 3

## 2016-01-15 MED ORDER — EPHEDRINE 5 MG/ML INJ
INTRAVENOUS | Status: AC
  Filled 2016-01-15: qty 10

## 2016-01-15 MED ORDER — PROPOFOL 10 MG/ML IV BOLUS
INTRAVENOUS | Status: DC | PRN
  Administered 2016-01-15: 200 mg via INTRAVENOUS

## 2016-01-15 MED ORDER — FENTANYL CITRATE (PF) 100 MCG/2ML IJ SOLN
INTRAMUSCULAR | Status: AC
  Filled 2016-01-15: qty 2

## 2016-01-15 MED ORDER — MIDAZOLAM HCL 2 MG/2ML IJ SOLN
1.0000 mg | INTRAMUSCULAR | Status: DC | PRN
  Administered 2016-01-15: 2 mg via INTRAVENOUS

## 2016-01-15 MED ORDER — FENTANYL CITRATE (PF) 100 MCG/2ML IJ SOLN
25.0000 ug | INTRAMUSCULAR | Status: DC | PRN
  Administered 2016-01-15: 50 ug via INTRAVENOUS

## 2016-01-15 MED ORDER — CEFAZOLIN SODIUM-DEXTROSE 2-4 GM/100ML-% IV SOLN
2.0000 g | INTRAVENOUS | Status: AC
  Administered 2016-01-15: 2 g via INTRAVENOUS

## 2016-01-15 MED ORDER — HYDROCODONE-ACETAMINOPHEN 5-325 MG PO TABS
ORAL_TABLET | ORAL | 0 refills | Status: AC
Start: 2016-01-15 — End: ?

## 2016-01-15 MED ORDER — ACETAMINOPHEN 325 MG PO TABS
650.0000 mg | ORAL_TABLET | Freq: Once | ORAL | Status: AC
  Administered 2016-01-15: 650 mg via ORAL

## 2016-01-15 MED ORDER — LIDOCAINE 2% (20 MG/ML) 5 ML SYRINGE
INTRAMUSCULAR | Status: DC | PRN
  Administered 2016-01-15: 100 mg via INTRAVENOUS

## 2016-01-15 MED ORDER — SCOPOLAMINE 1 MG/3DAYS TD PT72: 1.0000 | MEDICATED_PATCH | Freq: Once | TRANSDERMAL | Status: DC | PRN

## 2016-01-15 MED ORDER — FENTANYL CITRATE (PF) 100 MCG/2ML IJ SOLN
50.0000 ug | INTRAMUSCULAR | Status: DC | PRN
  Administered 2016-01-15: 100 ug via INTRAVENOUS

## 2016-01-15 MED ORDER — EPHEDRINE SULFATE 50 MG/ML IJ SOLN
INTRAMUSCULAR | Status: DC | PRN
  Administered 2016-01-15: 10 mg via INTRAVENOUS

## 2016-01-15 MED ORDER — BUPIVACAINE HCL (PF) 0.25 % IJ SOLN
INTRAMUSCULAR | Status: DC | PRN
  Administered 2016-01-15: 10 mL

## 2016-01-15 MED ORDER — CHLORHEXIDINE GLUCONATE 4 % EX LIQD: 60.0000 mL | Freq: Once | CUTANEOUS | Status: DC

## 2016-01-15 MED ORDER — DEXAMETHASONE SODIUM PHOSPHATE 10 MG/ML IJ SOLN
INTRAMUSCULAR | Status: DC | PRN
  Administered 2016-01-15: 10 mg via INTRAVENOUS

## 2016-01-15 MED ORDER — CEFAZOLIN SODIUM-DEXTROSE 2-4 GM/100ML-% IV SOLN
INTRAVENOUS | Status: AC
  Filled 2016-01-15: qty 100

## 2016-01-15 MED ORDER — ONDANSETRON HCL 4 MG/2ML IJ SOLN
INTRAMUSCULAR | Status: DC | PRN
  Administered 2016-01-15: 4 mg via INTRAVENOUS

## 2016-01-15 SURGICAL SUPPLY — 70 items
BAG DECANTER FOR FLEXI CONT (MISCELLANEOUS) IMPLANT
BANDAGE ACE 3X5.8 VEL STRL LF (GAUZE/BANDAGES/DRESSINGS) ×3 IMPLANT
BLADE MINI RND TIP GREEN BEAV (BLADE) IMPLANT
BLADE SURG 15 STRL LF DISP TIS (BLADE) ×2 IMPLANT
BLADE SURG 15 STRL SS (BLADE) ×4
BNDG ESMARK 4X9 LF (GAUZE/BANDAGES/DRESSINGS) ×3 IMPLANT
BNDG GAUZE ELAST 4 BULKY (GAUZE/BANDAGES/DRESSINGS) ×3 IMPLANT
BRUSH SCRUB EZ PLAIN DRY (MISCELLANEOUS) ×3 IMPLANT
CATH ROBINSON RED A/P 10FR (CATHETERS) IMPLANT
CHLORAPREP W/TINT 26ML (MISCELLANEOUS) ×3 IMPLANT
CONNECTOR NERVE AXOGUARD 2X15 (Orthopedic Implant) ×3 IMPLANT
CORDS BIPOLAR (ELECTRODE) ×3 IMPLANT
COVER BACK TABLE 60X90IN (DRAPES) ×3 IMPLANT
COVER MAYO STAND STRL (DRAPES) ×3 IMPLANT
CUFF TOURNIQUET SINGLE 18IN (TOURNIQUET CUFF) ×3 IMPLANT
DECANTER SPIKE VIAL GLASS SM (MISCELLANEOUS) ×3 IMPLANT
DEPRESSOR TONGUE BLADE STERILE (MISCELLANEOUS) ×3 IMPLANT
DRAPE EXTREMITY T 121X128X90 (DRAPE) ×3 IMPLANT
DRAPE SURG 17X23 STRL (DRAPES) ×3 IMPLANT
GAUZE SPONGE 4X4 12PLY STRL (GAUZE/BANDAGES/DRESSINGS) ×3 IMPLANT
GAUZE XEROFORM 1X8 LF (GAUZE/BANDAGES/DRESSINGS) ×3 IMPLANT
GLOVE BIO SURGEON STRL SZ7.5 (GLOVE) ×3 IMPLANT
GLOVE BIOGEL PI IND STRL 7.0 (GLOVE) ×1 IMPLANT
GLOVE BIOGEL PI IND STRL 7.5 (GLOVE) ×1 IMPLANT
GLOVE BIOGEL PI IND STRL 8 (GLOVE) ×2 IMPLANT
GLOVE BIOGEL PI IND STRL 8.5 (GLOVE) IMPLANT
GLOVE BIOGEL PI INDICATOR 7.0 (GLOVE) ×2
GLOVE BIOGEL PI INDICATOR 7.5 (GLOVE) ×2
GLOVE BIOGEL PI INDICATOR 8 (GLOVE) ×4
GLOVE BIOGEL PI INDICATOR 8.5 (GLOVE)
GLOVE ECLIPSE 6.5 STRL STRAW (GLOVE) ×3 IMPLANT
GLOVE SURG ORTHO 8.0 STRL STRW (GLOVE) IMPLANT
GOWN STRL REUS W/ TWL LRG LVL3 (GOWN DISPOSABLE) ×1 IMPLANT
GOWN STRL REUS W/TWL LRG LVL3 (GOWN DISPOSABLE) ×2
GOWN STRL REUS W/TWL XL LVL3 (GOWN DISPOSABLE) ×3 IMPLANT
GUIDE NRV 3X7RSRB NEURAGEN 2M (Tissue) IMPLANT
LOOP VESSEL MAXI BLUE (MISCELLANEOUS) IMPLANT
NDL SAFETY ECLIPSE 18X1.5 (NEEDLE) IMPLANT
NEEDLE HYPO 18GX1.5 SHARP (NEEDLE)
NEEDLE HYPO 25X1 1.5 SAFETY (NEEDLE) ×3 IMPLANT
NERVE GUIDE NEURAGEN 2MM (Tissue) IMPLANT
NS IRRIG 1000ML POUR BTL (IV SOLUTION) ×3 IMPLANT
PACK BASIN DAY SURGERY FS (CUSTOM PROCEDURE TRAY) ×3 IMPLANT
PAD CAST 3X4 CTTN HI CHSV (CAST SUPPLIES) ×1 IMPLANT
PAD CAST 4YDX4 CTTN HI CHSV (CAST SUPPLIES) IMPLANT
PADDING CAST ABS 4INX4YD NS (CAST SUPPLIES) ×2
PADDING CAST ABS COTTON 4X4 ST (CAST SUPPLIES) ×1 IMPLANT
PADDING CAST COTTON 3X4 STRL (CAST SUPPLIES) ×2
PADDING CAST COTTON 4X4 STRL (CAST SUPPLIES)
SLEEVE SCD COMPRESS KNEE MED (MISCELLANEOUS) ×3 IMPLANT
SPEAR EYE SURG WECK-CEL (MISCELLANEOUS) IMPLANT
SPLINT PLASTER CAST XFAST 3X15 (CAST SUPPLIES) IMPLANT
SPLINT PLASTER XTRA FASTSET 3X (CAST SUPPLIES)
STOCKINETTE 4X48 STRL (DRAPES) ×3 IMPLANT
SUT ETHIBOND 3-0 V-5 (SUTURE) IMPLANT
SUT ETHILON 4 0 PS 2 18 (SUTURE) ×3 IMPLANT
SUT FIBERWIRE 4-0 18 TAPR NDL (SUTURE)
SUT MERSILENE 4 0 P 3 (SUTURE) ×3 IMPLANT
SUT MERSILENE 6 0 P 1 (SUTURE) IMPLANT
SUT NYLON 9 0 VRM6 (SUTURE) ×6 IMPLANT
SUT PROLENE 6 0 P 1 18 (SUTURE) IMPLANT
SUT SILK 4 0 PS 2 (SUTURE) ×3 IMPLANT
SUT SUPRAMID 4-0 (SUTURE) IMPLANT
SUT VICRYL 4-0 PS2 18IN ABS (SUTURE) IMPLANT
SUTURE FIBERWR 4-0 18 TAPR NDL (SUTURE) IMPLANT
SYR BULB 3OZ (MISCELLANEOUS) ×3 IMPLANT
SYR CONTROL 10ML LL (SYRINGE) ×3 IMPLANT
TOWEL OR 17X24 6PK STRL BLUE (TOWEL DISPOSABLE) ×6 IMPLANT
TRAY DSU PREP LF (CUSTOM PROCEDURE TRAY) IMPLANT
UNDERPAD 30X30 (UNDERPADS AND DIAPERS) ×3 IMPLANT

## 2016-01-15 NOTE — Brief Op Note (Signed)
01/15/2016  4:45 PM  PATIENT:  Linda West  45 y.o. female  PRE-OPERATIVE DIAGNOSIS:  right long finger nerve laceration  POST-OPERATIVE DIAGNOSIS:  right long finger digital nerve and artery laceration, FDP zone 2 laceration  PROCEDURE:  Procedure(s): NERVE, TENDON AND ARTERY right long finger REPAIR as necessary (Right)  SURGEON:  Surgeon(s) and Role:    * Leanora Cover, MD - Primary  PHYSICIAN ASSISTANT:   ASSISTANTS: none   ANESTHESIA:   general  EBL:  Total I/O In: 1000 [I.V.:1000] Out: 7 [Blood:7]  BLOOD ADMINISTERED:none  DRAINS: none   LOCAL MEDICATIONS USED:  MARCAINE     SPECIMEN:  No Specimen  DISPOSITION OF SPECIMEN:  N/A  COUNTS:  YES  TOURNIQUET:   Total Tourniquet Time Documented: Upper Arm (Right) - 81 minutes Total: Upper Arm (Right) - 81 minutes   DICTATION: .Other Dictation: Dictation Number [id number]  PLAN OF CARE: Discharge to home after PACU  PATIENT DISPOSITION:  PACU - hemodynamically stable.

## 2016-01-15 NOTE — Op Note (Signed)
[id number] 

## 2016-01-15 NOTE — Discharge Instructions (Addendum)

## 2016-01-15 NOTE — H&P (Signed)
  Linda West is an 45 y.o. female.   Chief Complaint: right long finger digital nerve laceration HPI: 45 yo rhd female states she lacerated right long finger when a bowl she was holding broke.  Seen at ED where wound sutured.  Continued numbness on ulnar side of digit and electrical type pain.  She wishes to undergo exploration with repair of tendon/artery/nerve as necessary.  Allergies:  Allergies  Allergen Reactions  . Codeine Nausea And Vomiting  . Lodine [Etodolac] Nausea And Vomiting    Past Medical History:  Diagnosis Date  . Dermoid tumor   . Endometriosis     Past Surgical History:  Procedure Laterality Date  . ABDOMINAL HYSTERECTOMY      Family History: Family History  Problem Relation Age of Onset  . Hypertension Brother   . Heart attack Maternal Uncle   . Heart disease Maternal Uncle   . Heart attack Paternal Uncle   . Heart murmur Maternal Grandmother   . Heart attack Maternal Grandfather 89  . Heart failure Paternal Grandmother 64    Social History:   reports that she has never smoked. She has never used smokeless tobacco. She reports that she drinks alcohol. She reports that she does not use drugs.  Medications: No prescriptions prior to admission.    No results found for this or any previous visit (from the past 48 hour(s)).  No results found.   A comprehensive review of systems was negative.  There were no vitals taken for this visit.  General appearance: alert Head: Normocephalic, without obvious abnormality, atraumatic Neck: supple, symmetrical, trachea midline Resp: clear to auscultation bilaterally Cardio: regular rate and rhythm GI: non-tender Extremities: Intact sensation and capillary refill all digits.  +epl/fpl/io.  No wounds.  Pulses: 2+ and symmetric Skin: Skin color, texture, turgor normal. No rashes or lesions Neurologic: Grossly normal Incision/Wound:none  Assessment/Plan Right long finger laceration with nerve  laceration.  Plan exploration with repair of tendon/artery/nerve as necessary.  Risks, benefits, and alternatives of surgery were discussed and the patient agrees with the plan of care.   Tausha Milhoan R 01/15/2016, 9:49 AM

## 2016-01-15 NOTE — Transfer of Care (Signed)
Immediate Anesthesia Transfer of Care Note  Patient: Linda West  Procedure(s) Performed: Procedure(s): NERVE, TENDON AND ARTERY right long finger REPAIR as necessary (Right)  Patient Location: PACU  Anesthesia Type:General  Level of Consciousness: awake  Airway & Oxygen Therapy: Patient Spontanous Breathing and Patient connected to face mask oxygen  Post-op Assessment: Report given to RN and Post -op Vital signs reviewed and stable  Post vital signs: Reviewed and stable  Last Vitals:  Vitals:   01/15/16 1321 01/15/16 1648  BP: (!) 151/84   Pulse: 74 (!) 119  Resp: 20 (!) 22  Temp: 36.8 C     Last Pain:  Vitals:   01/15/16 1321  TempSrc: Oral         Complications: No apparent anesthesia complications

## 2016-01-15 NOTE — Anesthesia Postprocedure Evaluation (Signed)
Anesthesia Post Note  Patient: Linda West  Procedure(s) Performed: Procedure(s) (LRB): NERVE, TENDON AND ARTERY right long finger REPAIR as necessary (Right)  Patient location during evaluation: PACU Anesthesia Type: General Level of consciousness: awake and alert Pain management: pain level controlled Vital Signs Assessment: post-procedure vital signs reviewed and stable Respiratory status: spontaneous breathing, nonlabored ventilation, respiratory function stable and patient connected to nasal cannula oxygen Cardiovascular status: blood pressure returned to baseline and stable Postop Assessment: no signs of nausea or vomiting Anesthetic complications: no    Last Vitals:  Vitals:   01/15/16 1715 01/15/16 1730  BP: (!) 133/93 126/87  Pulse: (!) 116 (!) 101  Resp: 12 10  Temp:      Last Pain:  Vitals:   01/15/16 1730  TempSrc:   PainSc: 3                  Catalina Gravel

## 2016-01-15 NOTE — Anesthesia Procedure Notes (Signed)
Procedure Name: LMA Insertion Date/Time: 01/15/2016 3:08 PM Performed by: Lieutenant Diego Pre-anesthesia Checklist: Patient identified, Emergency Drugs available, Suction available and Patient being monitored Patient Re-evaluated:Patient Re-evaluated prior to inductionOxygen Delivery Method: Circle system utilized Preoxygenation: Pre-oxygenation with 100% oxygen Intubation Type: IV induction Ventilation: Mask ventilation without difficulty LMA: LMA inserted LMA Size: 4.0 Number of attempts: 1 Airway Equipment and Method: Bite block Placement Confirmation: positive ETCO2 and breath sounds checked- equal and bilateral Tube secured with: Tape Dental Injury: Teeth and Oropharynx as per pre-operative assessment

## 2016-01-15 NOTE — Anesthesia Preprocedure Evaluation (Addendum)
Anesthesia Evaluation  Patient identified by MRN, date of birth, ID band Patient awake    Reviewed: Allergy & Precautions, NPO status , Patient's Chart, lab work & pertinent test results  History of Anesthesia Complications Negative for: history of anesthetic complications  Airway Mallampati: I  TM Distance: <3 FB Neck ROM: Full    Dental  (+) Teeth Intact, Dental Advisory Given   Pulmonary asthma ,    Pulmonary exam normal breath sounds clear to auscultation       Cardiovascular Exercise Tolerance: Good negative cardio ROS Normal cardiovascular exam Rhythm:Regular Rate:Normal     Neuro/Psych PSYCHIATRIC DISORDERS Anxiety negative neurological ROS     GI/Hepatic negative GI ROS, Neg liver ROS,   Endo/Other  negative endocrine ROS  Renal/GU negative Renal ROS     Musculoskeletal negative musculoskeletal ROS (+)   Abdominal   Peds  Hematology negative hematology ROS (+)   Anesthesia Other Findings Day of surgery medications reviewed with the patient.  Reproductive/Obstetrics negative OB ROS Endometriosis                            Anesthesia Physical Anesthesia Plan  ASA: II  Anesthesia Plan: General   Post-op Pain Management:    Induction: Intravenous  Airway Management Planned: LMA  Additional Equipment:   Intra-op Plan:   Post-operative Plan: Extubation in OR  Informed Consent: I have reviewed the patients History and Physical, chart, labs and discussed the procedure including the risks, benefits and alternatives for the proposed anesthesia with the patient or authorized representative who has indicated his/her understanding and acceptance.   Dental advisory given  Plan Discussed with: CRNA  Anesthesia Plan Comments: (Risks/benefits of general anesthesia discussed with patient including risk of damage to teeth, lips, gum, and tongue, nausea/vomiting, allergic reactions  to medications, and the possibility of heart attack, stroke and death.  All patient questions answered.  Patient wishes to proceed.)        Anesthesia Quick Evaluation

## 2016-01-16 ENCOUNTER — Encounter (HOSPITAL_BASED_OUTPATIENT_CLINIC_OR_DEPARTMENT_OTHER): Payer: Self-pay | Admitting: Orthopedic Surgery

## 2016-01-16 NOTE — Op Note (Signed)
NAME:  Linda West, Linda West NO.:  [id number]  MEDICAL RECORD NO.:  [id number]  LOCATION:                                 FACILITY:  PHYSICIAN:  Leanora Cover, MD        DATE OF BIRTH:  [birth year]  DATE OF PROCEDURE:  01/15/2016 DATE OF DISCHARGE:                              OPERATIVE REPORT   PREOPERATIVE DIAGNOSIS:  Right long finger nerve laceration.  POSTOPERATIVE DIAGNOSES:  Right long finger ulnar digital nerve and ulnar digital artery laceration and FDP zone II partial laceration.  PROCEDURE:   1. Right long finger exploration with repair of zone II FDP tendon laceration 2. Right long finger repair of ulnar digital nerve with synthetic conduit under microscope 3. Right long finger repair of ulnar digital artery under microscope.  SURGEON:  Leanora Cover, MD.  ASSISTANT:  None.  ANESTHESIA:  General.  IV FLUIDS:  Per anesthesia flow sheet.  ESTIMATED BLOOD LOSS:  Minimal.  COMPLICATIONS:  None.  SPECIMENS:  None.  TOURNIQUET TIME:  81 minutes.  DISPOSITION:  Stable to PACU.  INDICATIONS:  Linda West is a 45 year old female who states she lacerated the right long finger when a bowl she was holding broke approximately 3 weeks ago.  She was seen at the emergency department where the wound was irrigated and sutured closed.  She has had continued pain in the finger as well as numbness.  I recommended operative exploration with repair of tendon, artery, and nerve as necessary. Risks, benefits, and alternatives of surgery were discussed including the risk of blood loss; infection; damage to nerves, vessels, tendons, ligaments, bone; failure of surgery; need for additional surgery; complications with wound healing; continued pain; and stiffness.  She voiced understanding of these risks and elected to proceed.  OPERATIVE COURSE:  After being identified preoperatively by myself, the patient and I agreed upon procedure and site of procedure.  Surgical site  was marked.  Risks, benefits, and alternatives of surgery were reviewed and she wished to proceed.  Surgical consent had been signed. She was given IV Ancef as preoperative antibiotic prophylaxis.  She was transported to the operating room and placed on the operating room table in supine position with the right upper extremity on arm board.  General anesthesia induced by anesthesiologist.  The right upper extremity was prepped and draped in normal sterile orthopedic fashion.  A surgical pause was performed between surgeons, anesthesia, and operating room staff, and all were in agreement as to the patient, procedure, and site of procedure.  Tourniquet at the proximal aspect of the extremity was inflated to 250 mmHg after exsanguination of the limb with an Esmarch bandage.  The finger was incised in a Brunner-type fashion including the traumatic portion of the wound.  This was carried in subcutaneous tissues by spreading technique.  The ulnar digital nerve was readily found and was noted to be 100% lacerated.  The ulnar digital artery was encased in scar.  There was a small rent in the tendon sheath. Examination of the tendons within this showed an approximately 25-30% laceration of the volar ulnar side of the FDP tendon.  The wound was copiously irrigated with  sterile saline.  The tendon partial laceration was repaired with a 4-0 Mersilene suture in a modified Kessler technique.  This provided good apposition of the tendon ends.  They were able to glide underneath the sheath easily.  The microscope was brought in.  The artery was addressed first.  It appeared that there had been a partial laceration of the artery that had been covered with scar.  This was carefully removed.  The arterial ends were freshened.  The ligament was cleared of tissue.  A 9-0 nylon suture was used in an interrupted circumferential fashion to reapproximate the arterial ends.  Good reapproximation was obtained  without undue tension.  Attention was turned to the nerve.  There was scar at the nerve ends.  The distal end was freshened first using a fresh 15 blade.  Budding fascicles were seen.  The proximal nerve ending had a neuroma forming.  Incision through this neuroma showed no fascicles.  Incision just proximal to this showed good budding fascicles.  The nerve ends were not able to be reapproximated without significant tension.  Axygen 2-mm tube was then used.  It was hydrated.  It was placed in the wound.  The proximal aspect of the nerve was brought into the tube with a horizontal mattress type suture.  The tube was then filled with saline.  The distal aspect of the nerve was brought into the tube again using a horizontal mattress type stitch.  There was no tension.  The wound was copiously irrigated with sterile saline and closed with 4-0 nylon in a horizontal mattress fashion.  A digital block was performed with 9 mL of 0.25% plain Marcaine to aid in postoperative analgesia.  The wound was then dressed with sterile Xeroform, 4x4s, and wrapped with a Kerlix bandage.  A dorsal blocking splint was placed with the wrist in approximately 35-40 degrees of flexion.  The MPs flexed and the IPs extended.  This was wrapped with Kerlix and Ace bandage.  Tourniquet was deflated at 81 minutes.  Fingertips were pink with brisk capillary refill after deflation of the tourniquet.  The operative drapes were broken down and the patient was awoken from anesthesia safely.  She was transferred back to stretcher and taken to PACU in stable condition.  I will see her back in the office in 1 week for postoperative followup.  I will give her Norco 5/325, 1 to 2 p.o. q.6 hours p.r.n. pain, dispensed #30.     Leanora Cover, MD     KK/MEDQ  D:  01/15/2016  T:  01/16/2016  Job:  [id number]

## 2016-04-09 ENCOUNTER — Telehealth: Payer: Self-pay | Admitting: Family Medicine

## 2016-04-09 ENCOUNTER — Ambulatory Visit: Payer: Self-pay | Admitting: Family Medicine

## 2016-04-09 NOTE — Telephone Encounter (Signed)
ERROR, Disregard

## 2018-05-07 IMAGING — DX DG HAND COMPLETE 3+V*L*
3 series · 3 of 3 positions shown · non-contrast
Comparison: None.

CLINICAL DATA: Cut middle finger on left hand.

EXAM:
LEFT HAND - COMPLETE 3+ VIEW

[x hand pa left]
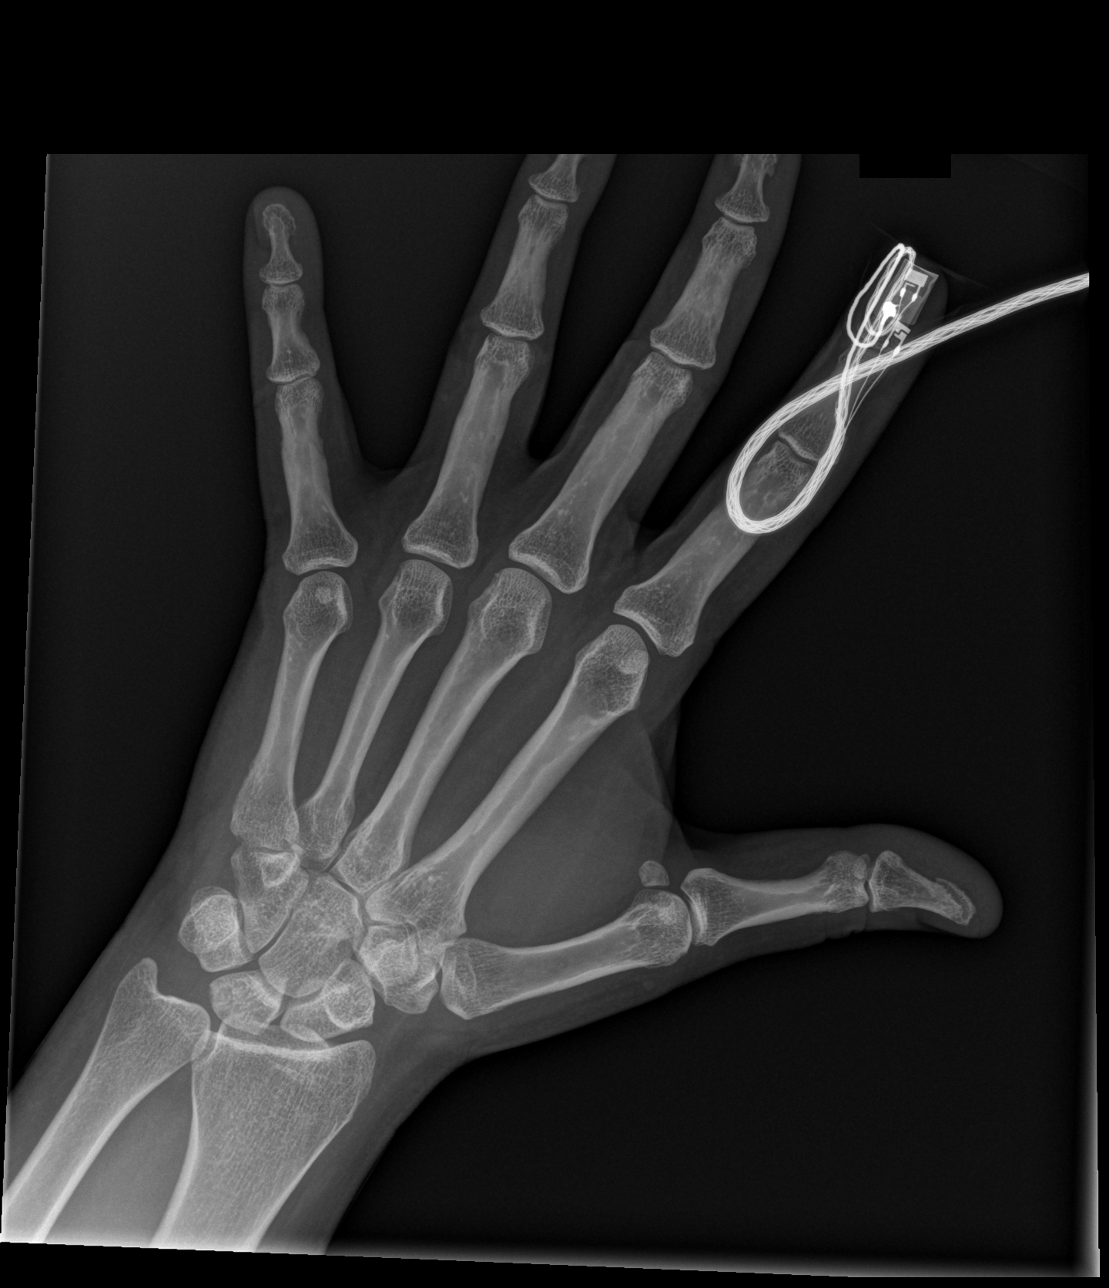

[x hand obl left]
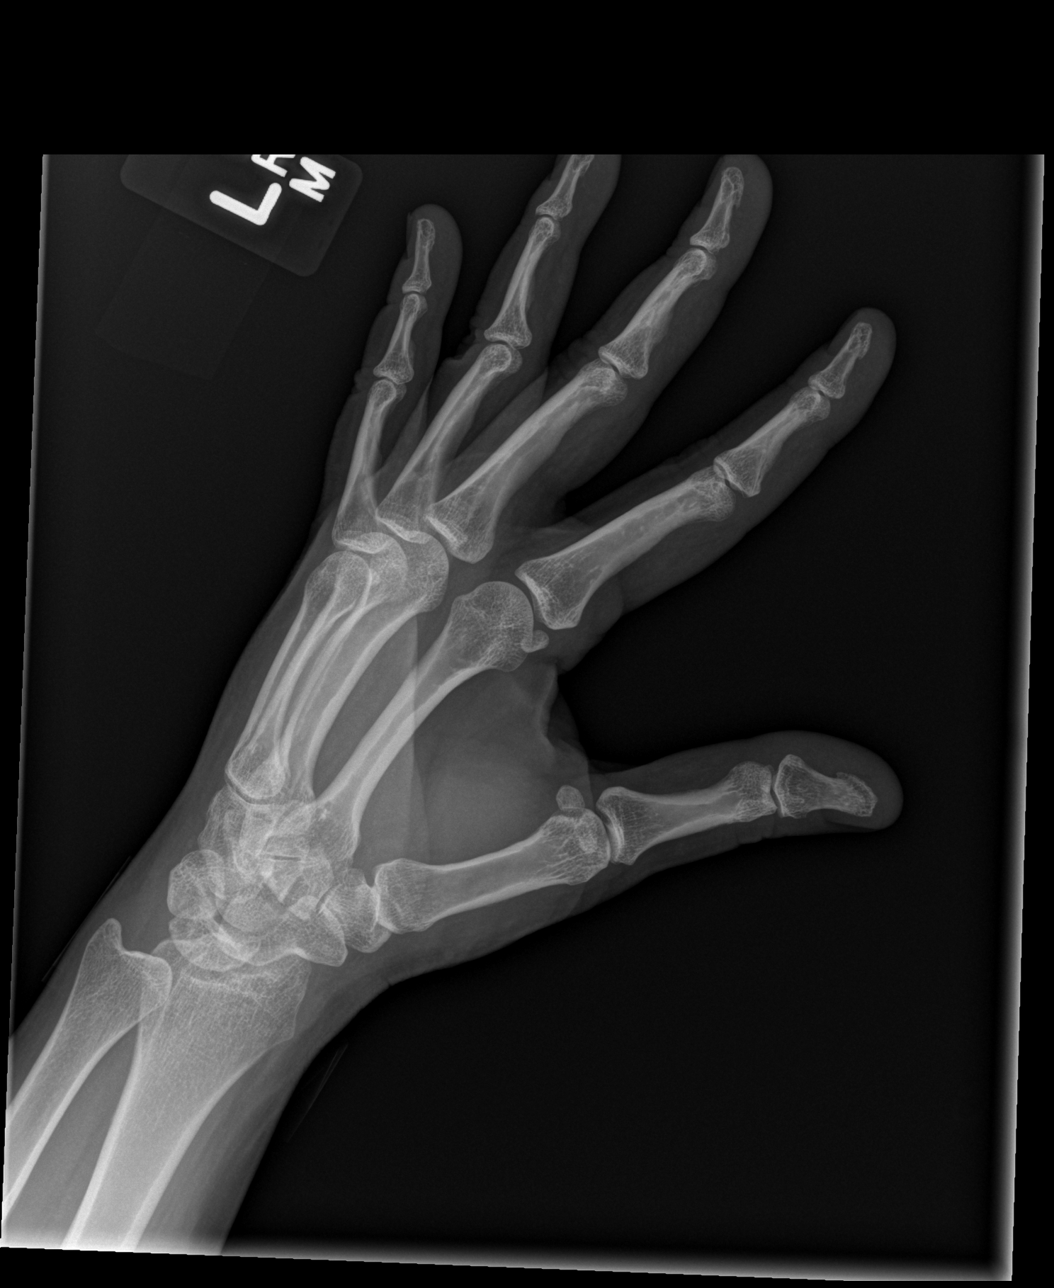

[x hand lat left]
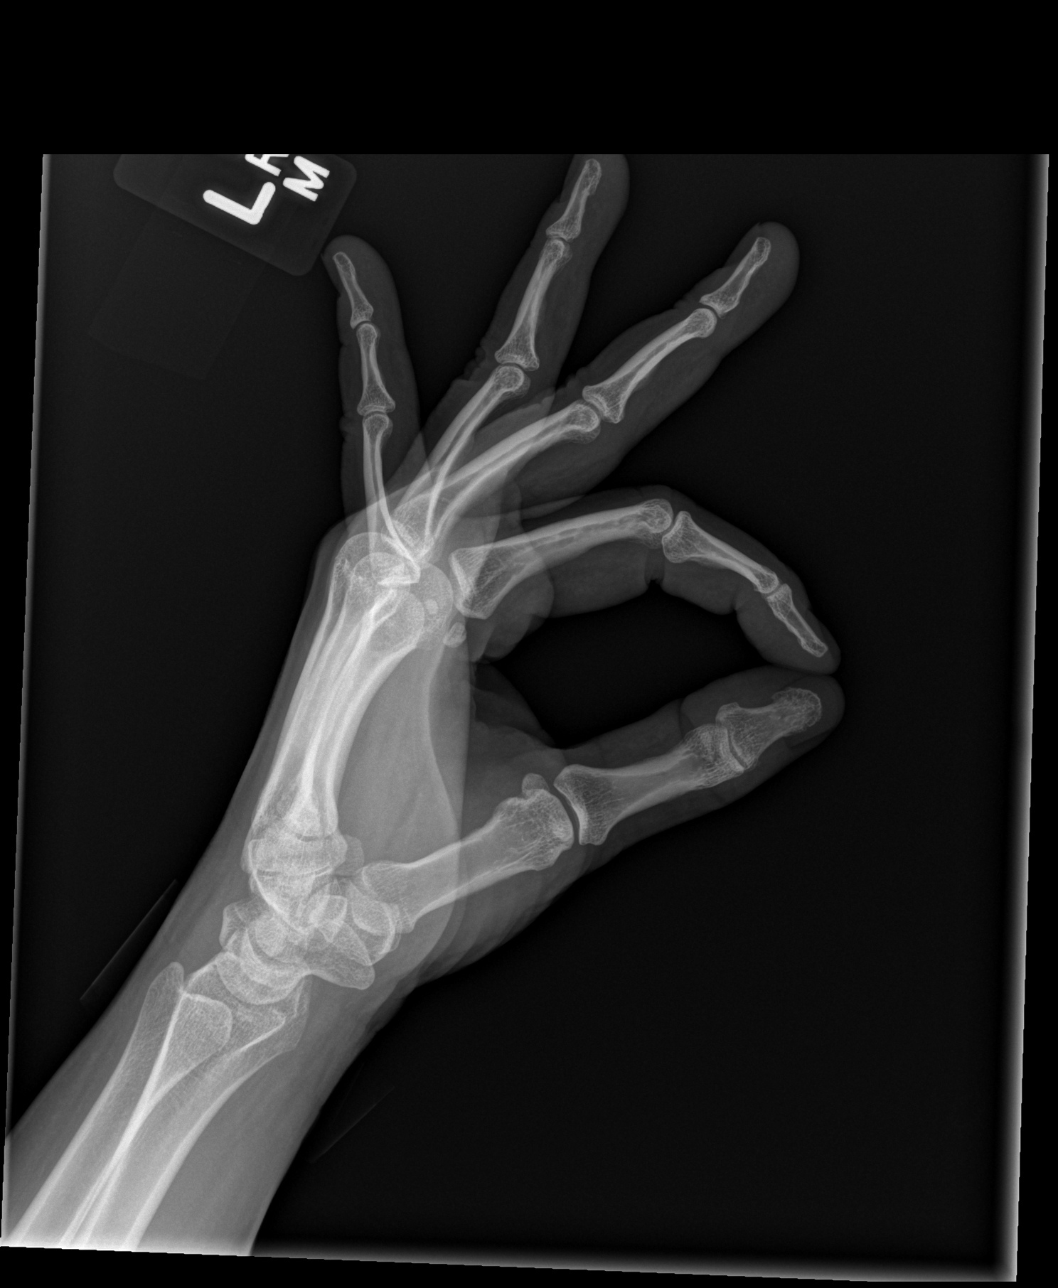

[3 of 3 positions shown; findings below may reference images not displayed]

FINDINGS: Anterior projection radiograph is degraded secondary to overlying
pulse oximetry device.

No fracture or dislocation with special attention paid to the middle
digit. Joint spaces are preserved. No erosions. Regional soft
tissues appear normal. No radiopaque foreign body.
IMPRESSION: No radiopaque foreign body.

## 2019-04-08 ENCOUNTER — Ambulatory Visit: Payer: BC Managed Care – PPO

## 2019-06-02 ENCOUNTER — Inpatient Hospital Stay (HOSPITAL_COMMUNITY): Payer: BC Managed Care – PPO

## 2019-06-02 ENCOUNTER — Emergency Department (HOSPITAL_COMMUNITY): Payer: BC Managed Care – PPO

## 2019-06-02 ENCOUNTER — Encounter (HOSPITAL_COMMUNITY): Payer: Self-pay | Admitting: Radiology

## 2019-06-02 ENCOUNTER — Inpatient Hospital Stay (HOSPITAL_COMMUNITY)
Admission: EM | Admit: 2019-06-02 | Discharge: 2019-06-07 | DRG: 200 | Disposition: A | Discharge status: Home / Self Care | Payer: BC Managed Care – PPO | Attending: General Surgery | Admitting: General Surgery

## 2019-06-02 DIAGNOSIS — T1490XA Injury, unspecified, initial encounter: Secondary | ICD-10-CM

## 2019-06-02 DIAGNOSIS — J9690 Respiratory failure, unspecified, unspecified whether with hypoxia or hypercapnia: Secondary | ICD-10-CM

## 2019-06-02 DIAGNOSIS — S2241XA Multiple fractures of ribs, right side, initial encounter for closed fracture: Secondary | ICD-10-CM

## 2019-06-02 DIAGNOSIS — Z9689 Presence of other specified functional implants: Secondary | ICD-10-CM

## 2019-06-02 DIAGNOSIS — S270XXA Traumatic pneumothorax, initial encounter: Secondary | ICD-10-CM

## 2019-06-02 DIAGNOSIS — J939 Pneumothorax, unspecified: Secondary | ICD-10-CM

## 2019-06-02 DIAGNOSIS — G4700 Insomnia, unspecified: Secondary | ICD-10-CM | POA: Diagnosis present

## 2019-06-02 DIAGNOSIS — N179 Acute kidney failure, unspecified: Secondary | ICD-10-CM | POA: Diagnosis present

## 2019-06-02 DIAGNOSIS — Y9352 Activity, horseback riding: Secondary | ICD-10-CM

## 2019-06-02 DIAGNOSIS — Z20822 Contact with and (suspected) exposure to covid-19: Secondary | ICD-10-CM | POA: Diagnosis present

## 2019-06-02 DIAGNOSIS — F419 Anxiety disorder, unspecified: Secondary | ICD-10-CM | POA: Diagnosis present

## 2019-06-02 DIAGNOSIS — R0781 Pleurodynia: Secondary | ICD-10-CM | POA: Diagnosis present

## 2019-06-02 HISTORY — PX: CHEST TUBE INSERTION: SHX231

## 2019-06-02 HISTORY — DX: Family history of other specified conditions: Z84.89

## 2019-06-02 LAB — CBC
HCT: 42.9 % (ref 36.0–46.0)
Hemoglobin: 13.9 g/dL (ref 12.0–15.0)
MCH: 30.3 pg (ref 26.0–34.0)
MCHC: 32.4 g/dL (ref 30.0–36.0)
MCV: 93.7 fL (ref 80.0–100.0)
Platelets: 292 10*3/uL (ref 150–400)
RBC: 4.58 MIL/uL (ref 3.87–5.11)
RDW: 12.4 % (ref 11.5–15.5)
WBC: 11.8 10*3/uL — ABNORMAL HIGH (ref 4.0–10.5)
nRBC: 0 % (ref 0.0–0.2)

## 2019-06-02 LAB — COMPREHENSIVE METABOLIC PANEL
ALT: 29 U/L (ref 0–44)
AST: 36 U/L (ref 15–41)
Albumin: 3.5 g/dL (ref 3.5–5.0)
Alkaline Phosphatase: 65 U/L (ref 38–126)
Anion gap: 10 (ref 5–15)
BUN: 11 mg/dL (ref 6–20)
CO2: 21 mmol/L — ABNORMAL LOW (ref 22–32)
Calcium: 8.9 mg/dL (ref 8.9–10.3)
Chloride: 108 mmol/L (ref 98–111)
Creatinine, Ser: 1.11 mg/dL — ABNORMAL HIGH (ref 0.44–1.00)
GFR calc Af Amer: >60 mL/min (ref >60)
GFR calc non Af Amer: 59 mL/min — ABNORMAL LOW (ref >60)
Glucose, Bld: 103 mg/dL — ABNORMAL HIGH (ref 70–99)
Potassium: 3.9 mmol/L (ref 3.5–5.1)
Sodium: 139 mmol/L (ref 135–145)
Total Bilirubin: 0.7 mg/dL (ref 0.3–1.2)
Total Protein: 6.9 g/dL (ref 6.5–8.1)

## 2019-06-02 LAB — I-STAT CHEM 8, ED
BUN: 11 mg/dL (ref 6–20)
Calcium, Ion: 1 mmol/L — ABNORMAL LOW (ref 1.15–1.40)
Chloride: 105 mmol/L (ref 98–111)
Creatinine, Ser: 1 mg/dL (ref 0.44–1.00)
Glucose, Bld: 98 mg/dL (ref 70–99)
HCT: 40 % (ref 36.0–46.0)
Hemoglobin: 13.6 g/dL (ref 12.0–15.0)
Potassium: 3.8 mmol/L (ref 3.5–5.1)
Sodium: 138 mmol/L (ref 135–145)
TCO2: 26 mmol/L (ref 22–32)

## 2019-06-02 LAB — RESPIRATORY PANEL BY RT PCR (FLU A&B, COVID)
Influenza A by PCR: NEGATIVE
Influenza B by PCR: NEGATIVE
SARS Coronavirus 2 by RT PCR: NEGATIVE

## 2019-06-02 LAB — I-STAT BETA HCG BLOOD, ED (MC, WL, AP ONLY): I-stat hCG, quantitative: <5 m[IU]/mL (ref <5)

## 2019-06-02 LAB — SAMPLE TO BLOOD BANK

## 2019-06-02 LAB — ETHANOL: Alcohol, Ethyl (B): <10 mg/dL (ref <10)

## 2019-06-02 LAB — PROTIME-INR
INR: 1 (ref 0.8–1.2)
Prothrombin Time: 12.8 seconds (ref 11.4–15.2)

## 2019-06-02 LAB — LACTIC ACID, PLASMA: Lactic Acid, Venous: 2.2 mmol/L (ref 0.5–1.9)

## 2019-06-02 MED ORDER — MIDAZOLAM HCL 2 MG/2ML IJ SOLN
INTRAMUSCULAR | Status: AC
  Filled 2019-06-02: qty 4

## 2019-06-02 MED ORDER — FENTANYL CITRATE (PF) 100 MCG/2ML IJ SOLN
INTRAMUSCULAR | Status: AC
  Filled 2019-06-02: qty 2

## 2019-06-02 MED ORDER — FENTANYL CITRATE (PF) 100 MCG/2ML IJ SOLN
INTRAMUSCULAR | Status: DC | PRN
  Administered 2019-06-02 (×3): 50 ug via INTRAVENOUS

## 2019-06-02 MED ORDER — FENTANYL CITRATE (PF) 100 MCG/2ML IJ SOLN
INTRAMUSCULAR | Status: AC
  Administered 2019-06-02: 50 ug via INTRAVENOUS
  Filled 2019-06-02: qty 2

## 2019-06-02 MED ORDER — ENOXAPARIN SODIUM 40 MG/0.4ML ~~LOC~~ SOLN
40.0000 mg | SUBCUTANEOUS | Status: DC
  Administered 2019-06-03 – 2019-06-06 (×4): 40 mg via SUBCUTANEOUS
  Filled 2019-06-02 (×4): qty 0.4

## 2019-06-02 MED ORDER — HYDROCODONE-ACETAMINOPHEN 5-325 MG PO TABS
2.0000 | ORAL_TABLET | ORAL | Status: DC | PRN
  Administered 2019-06-02 – 2019-06-04 (×9): 2 via ORAL
  Filled 2019-06-02 (×9): qty 2

## 2019-06-02 MED ORDER — HYDROCODONE-ACETAMINOPHEN 5-325 MG PO TABS: 1.0000 | ORAL_TABLET | ORAL | Status: DC | PRN

## 2019-06-02 MED ORDER — SODIUM CHLORIDE 0.9 % IV SOLN: INTRAVENOUS | Status: DC

## 2019-06-02 MED ORDER — ONDANSETRON 4 MG PO TBDP: 4.0000 mg | ORAL_TABLET | Freq: Four times a day (QID) | ORAL | Status: DC | PRN

## 2019-06-02 MED ORDER — ONDANSETRON HCL 4 MG/2ML IJ SOLN: 4.0000 mg | Freq: Four times a day (QID) | INTRAMUSCULAR | Status: DC | PRN

## 2019-06-02 MED ORDER — FENTANYL CITRATE (PF) 100 MCG/2ML IJ SOLN: 50.0000 ug | Freq: Once | INTRAMUSCULAR | Status: AC

## 2019-06-02 MED ORDER — METHOCARBAMOL 500 MG PO TABS
500.0000 mg | ORAL_TABLET | Freq: Three times a day (TID) | ORAL | Status: DC
  Administered 2019-06-02 – 2019-06-03 (×3): 500 mg via ORAL
  Filled 2019-06-02 (×3): qty 1

## 2019-06-02 MED ORDER — IOHEXOL 300 MG/ML  SOLN
100.0000 mL | Freq: Once | INTRAMUSCULAR | Status: AC | PRN
  Administered 2019-06-02: 100 mL via INTRAVENOUS

## 2019-06-02 MED ORDER — FENTANYL CITRATE (PF) 100 MCG/2ML IJ SOLN
25.0000 ug | INTRAMUSCULAR | Status: DC | PRN
  Administered 2019-06-02: 25 ug via INTRAVENOUS
  Filled 2019-06-02: qty 2

## 2019-06-02 MED ORDER — KETOROLAC TROMETHAMINE 15 MG/ML IJ SOLN
15.0000 mg | Freq: Three times a day (TID) | INTRAMUSCULAR | Status: DC
  Administered 2019-06-02 – 2019-06-04 (×6): 15 mg via INTRAVENOUS
  Filled 2019-06-02 (×6): qty 1

## 2019-06-02 MED ORDER — MIDAZOLAM HCL 5 MG/5ML IJ SOLN
INTRAMUSCULAR | Status: AC | PRN
  Administered 2019-06-02 (×2): 2 mg via INTRAVENOUS

## 2019-06-02 MED ORDER — DOCUSATE SODIUM 100 MG PO CAPS
100.0000 mg | ORAL_CAPSULE | Freq: Two times a day (BID) | ORAL | Status: DC
  Administered 2019-06-02 – 2019-06-07 (×10): 100 mg via ORAL
  Filled 2019-06-02 (×10): qty 1

## 2019-06-02 NOTE — Plan of Care (Signed)
  Problem: Education: Goal: Knowledge of General Education information will improve Description Including pain rating scale, medication(s)/side effects and non-pharmacologic comfort measures Outcome: Progressing   

## 2019-06-02 NOTE — ED Notes (Signed)
Did manual blood pressure it was 118/72

## 2019-06-02 NOTE — Progress Notes (Signed)
Per Dr Donne Hazel, d/t CXR showing R pneumothorax will need to delay CT scans. Will notify CT and complete scans once procedure complete.

## 2019-06-02 NOTE — ED Notes (Signed)
To CT with TRN Monico Hoar, RN

## 2019-06-02 NOTE — Procedures (Signed)
Preop dx fall from horse, right ptx Postop dx saa Procedure right chest pigtail placement Matt Shahil Speegle Local with sedation ebl minimal Complications none  Indications: this is 53 yof s/p fall from horse with loc and pain right chest. Plain film with right ptx.discussed urgent chest tube placement  Procedure: patient was sedated.  Chest prepped and draped standard sterile surgical fashion. I had maximal protection on.    I infiltrated lidocaine in right chest above im fold anterior axillary line.  I then made an incision. I then accessed the right chest easily with needle and aspirated air.  I then passed the wire.  I dilated the tract.  I then placed the catheter and removed the wire and dilator.  I secured the catheter with silk suture. There was air out of it. I placed dressing and hooked to pleurevac.  Tolerated well and directly to ct scan

## 2019-06-02 NOTE — H&P (Signed)
Linda West is an 49 y.o. female.   Chief Complaint: fall off horse HPI: 10 yof fall from horse. Does not remember event.  Awake alert now with right sided chest pain.  pmh anxiety psh uterine surgery for tumor? meds unknown right now Nonsmoker No allergies  Results for orders placed or performed during the hospital encounter of 06/02/19 (from the past 48 hour(s))  CBC     Status: Abnormal   Collection Time: 06/02/19 12:42 PM  Result Value Ref Range   WBC 11.8 (H) 4.0 - 10.5 K/uL   RBC 4.58 3.87 - 5.11 MIL/uL   Hemoglobin 13.9 12.0 - 15.0 g/dL   HCT 42.9 36.0 - 46.0 %   MCV 93.7 80.0 - 100.0 fL   MCH 30.3 26.0 - 34.0 pg   MCHC 32.4 30.0 - 36.0 g/dL   RDW 12.4 11.5 - 15.5 %   Platelets 292 150 - 400 K/uL   nRBC 0.0 0.0 - 0.2 %    Comment: Performed at [facility], [place] [address], [place], [place] [address]  Protime-INR     Status: None   Collection Time: 06/02/19 12:42 PM  Result Value Ref Range   Prothrombin Time 12.8 11.4 - 15.2 seconds   INR 1.0 0.8 - 1.2    Comment: (NOTE) INR goal varies based on device and disease states. Performed at [facility], [place] [address] [place], [place] [address]   Sample to Blood Bank     Status: None   Collection Time: 06/02/19 12:42 PM  Result Value Ref Range   Blood Bank Specimen SAMPLE AVAILABLE FOR TESTING    Sample Expiration      06/03/2019,2359 Performed at [facility], [place] [address] [place], [place] [address]   I-Stat Beta hCG blood, ED ([facility], [facility], [facility] only)     Status: None   Collection Time: 06/02/19 12:49 PM  Result Value Ref Range   I-stat hCG, quantitative <5.0 <5 mIU/mL   Comment 3            Comment:   GEST. AGE      CONC.  (mIU/mL)   <=1 WEEK        5 - 50     2 WEEKS       50 - 500     3 WEEKS       100 - 10,000     4 WEEKS     1,000 - 30,000        FEMALE AND NON-PREGNANT FEMALE:     LESS THAN 5 mIU/mL   I-Stat Chem 8, ED     Status: Abnormal   Collection Time: 06/02/19 12:50 PM  Result  Value Ref Range   Sodium 138 135 - 145 mmol/L   Potassium 3.8 3.5 - 5.1 mmol/L   Chloride 105 98 - 111 mmol/L   BUN 11 6 - 20 mg/dL   Creatinine, Ser 1.00 0.44 - 1.00 mg/dL   Glucose, Bld 98 70 - 99 mg/dL    Comment: Glucose reference range applies only to samples taken after fasting for at least 8 hours.   Calcium, Ion 1.00 (L) 1.15 - 1.40 mmol/L   TCO2 26 22 - 32 mmol/L   Hemoglobin 13.6 12.0 - 15.0 g/dL   HCT 40.0 36.0 - 46.0 %   No results found.  Review of Systems  Cardiovascular: Positive for chest pain.  All other systems reviewed and are negative.   Blood pressure (  S) 118/72, pulse 100, temperature (!) 96.9 F (36.1 C), temperature source Oral, resp. rate 16, height 5\' 10"  (1.778 m), weight 83.9 kg, SpO2 97 %. Physical Exam  Vitals reviewed. Constitutional: She is oriented to person, place, and time. She appears well-developed and well-nourished.  HENT:  Head: Normocephalic and atraumatic.  Right Ear: External ear normal.  Left Ear: External ear normal.  Mouth/Throat: Oropharynx is clear and moist.  Eyes: Pupils are equal, round, and reactive to light. EOM are normal. No scleral icterus.  Neck: No tracheal deviation present. No thyromegaly present.  Cardiovascular: Normal rate, regular rhythm, normal heart sounds and intact distal pulses.  Respiratory: Effort normal and breath sounds normal. She exhibits tenderness (right laterarl).  GI: Soft. Bowel sounds are normal. She exhibits no distension. There is no abdominal tenderness.  Musculoskeletal:        General: No tenderness, deformity or edema. Normal range of motion.     Cervical back: Neck supple.  Lymphadenopathy:    She has no cervical adenopathy.  Neurological: She is alert and oriented to person, place, and time. She has normal strength. No sensory deficit. GCS eye subscore is 4. GCS verbal subscore is 5. GCS motor subscore is 6.  Skin: Skin is warm and dry. No rash noted.  Psychiatric: She has a normal mood  and affect. Her behavior is normal.     Assessment/Plan Fall off horse Right ptx/right rib fractures 5-9- chest tube placed in er -pain control -pulm toilet, ics -repeat cxr in am Lovenox, scds  Discussed plan with patient and her husband  Rolm Bookbinder, MD 06/02/2019, 1:35 PM

## 2019-06-02 NOTE — ED Notes (Signed)
Dr. Wakefield at bedside. 

## 2019-06-02 NOTE — ED Notes (Signed)
Help get patient on undress on the monitor patient is resting with call bell in reach

## 2019-06-02 NOTE — ED Provider Notes (Signed)
[facility]ER[facility]ENC[facility] DEPARTMENT Provider Note   CSN: [id number] Arrival date & time: 06/02/19  1235     History No chief complaint on file.   Linda West is a 49 y.o. female.  The history is provided by the patient and the EMS personnel. The history is limited by the condition of the patient.  Trauma Mechanism of injury: fall Injury location: head/neck and torso Injury location detail: neck and R chest, R flank and abd RUQ Incident location: outdoors Time since incident: 30 minutes Arrived directly from scene: yes   Fall:      Fall occurred: from a horse.      Height of fall: horse height      Entrapped after fall: no      Suspicion of alcohol use: no      Suspicion of drug use: no  EMS/PTA data:      Ambulatory at scene: no      Responsiveness: alert      Oriented to: person      Loss of consciousness: uncertain.      Amnesic to event: yes      Airway interventions: none      Breathing interventions: none      IV access: established      Cardiac interventions: none  Current symptoms:      Pain scale: 9/10      Associated symptoms:            Reports abdominal pain, back pain, chest pain, headache and neck pain.            Denies nausea, seizures and vomiting. Loss of consciousness: uncertain.       No past medical history on file.  There are no problems to display for this patient.   History reviewed. No pertinent surgical history.   OB History   No obstetric history on file.     No family history on file.  Social History   Tobacco Use  . Smoking status: Not on file  Substance Use Topics  . Alcohol use: Not on file  . Drug use: Not on file    Home Medications Prior to Admission medications   Not on File    Allergies    Patient has no allergy information on record.  Review of Systems   Review of Systems  Constitutional: Negative for chills, fatigue and fever.  HENT: Negative for congestion.   Eyes: Negative for  visual disturbance.  Respiratory: Positive for chest tightness and shortness of breath. Negative for cough.   Cardiovascular: Positive for chest pain.  Gastrointestinal: Positive for abdominal pain. Negative for diarrhea, nausea and vomiting.  Genitourinary: Positive for flank pain. Negative for dysuria.  Musculoskeletal: Positive for back pain and neck pain.  Skin: Negative for rash and wound.  Neurological: Positive for headaches. Negative for seizures and speech difficulty. Loss of consciousness: uncertain.  Psychiatric/Behavioral: Positive for confusion. Negative for agitation. The patient is nervous/anxious.   All other systems reviewed and are negative.   Physical Exam Updated Vital Signs BP 120/75 (BP Location: Right Arm)   Pulse 89   Temp 98.3 F (36.8 C) (Oral)   Resp 17   Ht 5\' 10"  (1.778 m)   Wt 83.9 kg   SpO2 100%   BMI 26.54 kg/m   Physical Exam Vitals and nursing note reviewed.  Constitutional:      General: She is not in acute distress.    Appearance: She is not ill-appearing, toxic-appearing  or diaphoretic.  HENT:     Head: Normocephalic.     Nose: No congestion or rhinorrhea.     Mouth/Throat:     Mouth: Mucous membranes are moist.     Pharynx: No oropharyngeal exudate or posterior oropharyngeal erythema.  Eyes:     Extraocular Movements: Extraocular movements intact.     Conjunctiva/sclera: Conjunctivae normal.     Pupils: Pupils are equal, round, and reactive to light.  Neck:   Cardiovascular:     Rate and Rhythm: Regular rhythm. Tachycardia present.     Pulses: Normal pulses.     Heart sounds: No murmur.  Pulmonary:     Effort: No respiratory distress.     Comments: Decreased breath sounds on R.  Chest:     Chest wall: Tenderness present.    Abdominal:     General: Abdomen is flat. Bowel sounds are normal. There is no distension.     Tenderness: There is abdominal tenderness in the right upper quadrant. There is no right CVA tenderness or  left CVA tenderness.    Musculoskeletal:        General: Tenderness present.     Cervical back: Tenderness present. Muscular tenderness present. No spinous process tenderness.     Right lower leg: No edema.     Left lower leg: No edema.  Skin:    Capillary Refill: Capillary refill takes less than 2 seconds.     Findings: No erythema.  Neurological:     General: No focal deficit present.     Mental Status: She is alert.     Sensory: No sensory deficit.     Motor: No weakness.     ED Results / Procedures / Treatments   Labs (all labs ordered are listed, but only abnormal results are displayed) Labs Reviewed  COMPREHENSIVE METABOLIC PANEL - Abnormal; Notable for the following components:      Result Value   CO2 21 (*)    Glucose, Bld 103 (*)    Creatinine, Ser 1.11 (*)    GFR calc non Af Amer 59 (*)    All other components within normal limits  CBC - Abnormal; Notable for the following components:   WBC 11.8 (*)    All other components within normal limits  LACTIC ACID, PLASMA - Abnormal; Notable for the following components:   Lactic Acid, Venous 2.2 (*)    All other components within normal limits  I-STAT CHEM 8, ED - Abnormal; Notable for the following components:   Calcium, Ion 1.00 (*)    All other components within normal limits  RESPIRATORY PANEL BY RT PCR (FLU A&B, COVID)  ETHANOL  PROTIME-INR  URINALYSIS, ROUTINE W REFLEX MICROSCOPIC  I-STAT BETA HCG BLOOD, ED ([facility], [facility], [facility] ONLY)  SAMPLE TO BLOOD BANK    EKG None  Radiology CT Head Wo Contrast  Result Date: 06/02/2019 CLINICAL DATA:  49 year old female with trauma, thrown from horse EXAM: CT HEAD WITHOUT CONTRAST CT CERVICAL SPINE WITHOUT CONTRAST TECHNIQUE: Multidetector CT imaging of the head and cervical spine was performed following the standard protocol without intravenous contrast. Multiplanar CT image reconstructions of the cervical spine were also generated. COMPARISON:  None. FINDINGS: CT HEAD FINDINGS  Brain: No acute intracranial hemorrhage. No midline shift or mass effect. Gray-white differentiation maintained. Unremarkable appearance of the ventricular system. Vascular: Unremarkable. Skull: No acute fracture.  No aggressive bone lesion identified. Sinuses/Orbits: Unremarkable appearance of the orbits. Mastoid air cells clear. No middle ear effusion. No significant sinus  disease. Other: None CT CERVICAL SPINE FINDINGS Alignment: Craniocervical junction aligned. Anatomic alignment of the cervical elements. No subluxation. Skull base and vertebrae: No acute fracture at the skullbase. Vertebral body heights relatively maintained. No acute fracture identified. Soft tissues and spinal canal: Unremarkable cervical soft tissues. Lymph nodes are present, though not enlarged. Disc levels: Irregular superior endplates of C6 and C7, chronic though without comparison. Anterior osteophyte production at C5-C6 and C6-C7. No bony canal narrowing or foraminal narrowing. Upper chest: Unremarkable appearance of the lung apices. Other: Subcentimeter right-sided thyroid lesions do not require any additional specific follow-up. IMPRESSION: Head CT: Negative for acute intracranial abnormality. Cervical CT: No acute fracture or malalignment of the cervical spine. Early degenerative changes involving C5-C6 and C6-C7. Electronically Signed   By: Corrie Mckusick D.O.   On: 06/02/2019 14:53   CT Chest W Contrast  Result Date: 06/02/2019 CLINICAL DATA:  49 year old female with a history of trauma EXAM: CT CHEST, ABDOMEN, AND PELVIS WITH CONTRAST TECHNIQUE: Multidetector CT imaging of the chest, abdomen and pelvis was performed following the standard protocol during bolus administration of intravenous contrast. CONTRAST:  135mL OMNIPAQUE IOHEXOL 300 MG/ML  SOLN COMPARISON:  None. FINDINGS: CT CHEST FINDINGS Cardiovascular: Heart size within normal limits. No pericardial fluid/thickening. No significant coronary artery disease. Normal course  caliber contour of the thoracic aorta. Branch vessels are patent. No significant atherosclerosis. No periaortic fluid. Unremarkable diameter of the main pulmonary artery. Mediastinum/Nodes: No mediastinal lymphadenopathy. Unremarkable course of the thoracic esophagus. No substernal hematoma. No mediastinal gas. Unremarkable thoracic inlet. Lungs/Pleura: Right-sided thoracostomy tube terminates at the anterior pleural space. Re-expansion of the right lung with trace residual air at the apex. Atelectasis and/or contusion at the dependent right lung base. No pleural effusion. Left lung well aerated without confluent airspace disease. No left pneumothorax or pleural effusion. Musculoskeletal: Nondisplaced rib fractures of the right 5-9 ribs. Vertebral body heights maintained with no acute fracture. No spinous process fracture identified. CT ABDOMEN PELVIS FINDINGS Hepatobiliary: Unremarkable liver.  Unremarkable gallbladder. Pancreas: Unremarkable pancreas Spleen: Unremarkable spleen Adrenals/Urinary Tract: - Right adrenal gland:  Unremarkable - Left adrenal gland: Unremarkable. - Right kidney: No hydronephrosis, nephrolithiasis, inflammation, or ureteral dilation. No focal lesion. - Left Kidney: No hydronephrosis, nephrolithiasis, inflammation, or ureteral dilation. No focal lesion. - Urinary Bladder: Unremarkable. Stomach/Bowel: - Stomach: Unremarkable. - Small bowel: Unremarkable - Appendix: Normal. - Colon: Unremarkable. Vascular/Lymphatic: No significant atherosclerotic changes. Unremarkable course caliber contour of the abdominal aorta, mesenteric arteries, renal arteries. Unremarkable bilateral iliac arteries. Proximal femoral arteries are patent. Unremarkable systemic venous system and portal venous system. No adenopathy Reproductive: Hysterectomy Other: None Musculoskeletal: Vertebral body heights maintained. No fracture of the lumbar vertebral bodies. No bony canal narrowing. Bilateral hips congruent. No  pelvic fracture. No transverse process fracture identified IMPRESSION: Right-sided pigtail thoracostomy tube terminates in the anterior pleural space with near complete re-expansion of the right lung compared to prior plain film. Dependent right lung atelectasis/contusion. Nondisplaced rib fractures of the right 5-9 ribs. Negative CT for evidence of thoracic or lumbar spine fracture. Preliminary results were discussed in person at the time of interpretation on 06/02/2019 at 2:14 pm with Dr. Donne Hazel. Electronically Signed   By: Corrie Mckusick D.O.   On: 06/02/2019 14:16   CT Cervical Spine Wo Contrast  Result Date: 06/02/2019 CLINICAL DATA:  49 year old female with trauma, thrown from horse EXAM: CT HEAD WITHOUT CONTRAST CT CERVICAL SPINE WITHOUT CONTRAST TECHNIQUE: Multidetector CT imaging of the head and cervical spine was performed following  the standard protocol without intravenous contrast. Multiplanar CT image reconstructions of the cervical spine were also generated. COMPARISON:  None. FINDINGS: CT HEAD FINDINGS Brain: No acute intracranial hemorrhage. No midline shift or mass effect. Gray-white differentiation maintained. Unremarkable appearance of the ventricular system. Vascular: Unremarkable. Skull: No acute fracture.  No aggressive bone lesion identified. Sinuses/Orbits: Unremarkable appearance of the orbits. Mastoid air cells clear. No middle ear effusion. No significant sinus disease. Other: None CT CERVICAL SPINE FINDINGS Alignment: Craniocervical junction aligned. Anatomic alignment of the cervical elements. No subluxation. Skull base and vertebrae: No acute fracture at the skullbase. Vertebral body heights relatively maintained. No acute fracture identified. Soft tissues and spinal canal: Unremarkable cervical soft tissues. Lymph nodes are present, though not enlarged. Disc levels: Irregular superior endplates of C6 and C7, chronic though without comparison. Anterior osteophyte production at C5-C6  and C6-C7. No bony canal narrowing or foraminal narrowing. Upper chest: Unremarkable appearance of the lung apices. Other: Subcentimeter right-sided thyroid lesions do not require any additional specific follow-up. IMPRESSION: Head CT: Negative for acute intracranial abnormality. Cervical CT: No acute fracture or malalignment of the cervical spine. Early degenerative changes involving C5-C6 and C6-C7. Electronically Signed   By: Corrie Mckusick D.O.   On: 06/02/2019 14:53   CT ABDOMEN PELVIS W CONTRAST  Result Date: 06/02/2019 CLINICAL DATA:  49 year old female with a history of trauma EXAM: CT CHEST, ABDOMEN, AND PELVIS WITH CONTRAST TECHNIQUE: Multidetector CT imaging of the chest, abdomen and pelvis was performed following the standard protocol during bolus administration of intravenous contrast. CONTRAST:  178mL OMNIPAQUE IOHEXOL 300 MG/ML  SOLN COMPARISON:  None. FINDINGS: CT CHEST FINDINGS Cardiovascular: Heart size within normal limits. No pericardial fluid/thickening. No significant coronary artery disease. Normal course caliber contour of the thoracic aorta. Branch vessels are patent. No significant atherosclerosis. No periaortic fluid. Unremarkable diameter of the main pulmonary artery. Mediastinum/Nodes: No mediastinal lymphadenopathy. Unremarkable course of the thoracic esophagus. No substernal hematoma. No mediastinal gas. Unremarkable thoracic inlet. Lungs/Pleura: Right-sided thoracostomy tube terminates at the anterior pleural space. Re-expansion of the right lung with trace residual air at the apex. Atelectasis and/or contusion at the dependent right lung base. No pleural effusion. Left lung well aerated without confluent airspace disease. No left pneumothorax or pleural effusion. Musculoskeletal: Nondisplaced rib fractures of the right 5-9 ribs. Vertebral body heights maintained with no acute fracture. No spinous process fracture identified. CT ABDOMEN PELVIS FINDINGS Hepatobiliary: Unremarkable  liver.  Unremarkable gallbladder. Pancreas: Unremarkable pancreas Spleen: Unremarkable spleen Adrenals/Urinary Tract: - Right adrenal gland:  Unremarkable - Left adrenal gland: Unremarkable. - Right kidney: No hydronephrosis, nephrolithiasis, inflammation, or ureteral dilation. No focal lesion. - Left Kidney: No hydronephrosis, nephrolithiasis, inflammation, or ureteral dilation. No focal lesion. - Urinary Bladder: Unremarkable. Stomach/Bowel: - Stomach: Unremarkable. - Small bowel: Unremarkable - Appendix: Normal. - Colon: Unremarkable. Vascular/Lymphatic: No significant atherosclerotic changes. Unremarkable course caliber contour of the abdominal aorta, mesenteric arteries, renal arteries. Unremarkable bilateral iliac arteries. Proximal femoral arteries are patent. Unremarkable systemic venous system and portal venous system. No adenopathy Reproductive: Hysterectomy Other: None Musculoskeletal: Vertebral body heights maintained. No fracture of the lumbar vertebral bodies. No bony canal narrowing. Bilateral hips congruent. No pelvic fracture. No transverse process fracture identified IMPRESSION: Right-sided pigtail thoracostomy tube terminates in the anterior pleural space with near complete re-expansion of the right lung compared to prior plain film. Dependent right lung atelectasis/contusion. Nondisplaced rib fractures of the right 5-9 ribs. Negative CT for evidence of thoracic or lumbar spine fracture. Preliminary results were  discussed in person at the time of interpretation on 06/02/2019 at 2:14 pm with Dr. Donne Hazel. Electronically Signed   By: Corrie Mckusick D.O.   On: 06/02/2019 14:16   DG Pelvis Portable  Result Date: 06/02/2019 CLINICAL DATA:  Fall from horse EXAM: PORTABLE PELVIS 1-2 VIEWS COMPARISON:  None. FINDINGS: There is no evidence of pelvic fracture or diastasis. No pelvic bone lesions are seen. IMPRESSION: No displaced fracture of the pelvis or bilateral proximal femurs on single frontal  radiograph. Electronically Signed   By: Eddie Candle M.D.   On: 06/02/2019 13:59   DG Chest Port 1 View  Result Date: 06/02/2019 CLINICAL DATA:  49 year old female with injury, fall from horse EXAM: PORTABLE CHEST 1 VIEW COMPARISON:  None. FINDINGS: Cardiomediastinal silhouette within normal limits in size and contour. Left lung without confluent airspace disease pneumothorax or pleural effusion. Right-sided pneumothorax. Nondisplaced rib fracture of the posterior fifth rib with questionable nondisplaced rib fractures of the sixth and seventh ribs. Using Collins method of % PTX = 4.2 + 4.7 * (A+B+C) where A =3.4cm, B =2.1cm, C = 1.2cm percentage is estimated 36%. IMPRESSION: Right-sided pneumothorax, estimated 36% using Collins method. Critical Value/emergent results were discussed in person at the time of interpretation on 06/02/2019 at 2:05 pm with Dr. Rolm Bookbinder at the time of interpretation. Nondisplaced right fifth rib fracture with questionable fractures of ribs 6 and 7. Electronically Signed   By: Corrie Mckusick D.O.   On: 06/02/2019 14:05    Procedures Procedures (including critical care time)  Medications Ordered in ED Medications  fentaNYL (SUBLIMAZE) 100 MCG/2ML injection (has no administration in time range)  midazolam (VERSED) 2 MG/2ML injection (has no administration in time range)  enoxaparin (LOVENOX) injection 40 mg (has no administration in time range)  0.9 %  sodium chloride infusion ( Intravenous New Bag/Given 06/02/19 1613)  HYDROcodone-acetaminophen (NORCO/VICODIN) 5-325 MG per tablet 1 tablet (has no administration in time range)  HYDROcodone-acetaminophen (NORCO/VICODIN) 5-325 MG per tablet 2 tablet (2 tablets Oral Given 06/02/19 1953)  fentaNYL (SUBLIMAZE) injection 25 mcg (25 mcg Intravenous Given 06/02/19 1448)  ketorolac (TORADOL) 15 MG/ML injection 15 mg (15 mg Intravenous Given 06/02/19 1447)  docusate sodium (COLACE) capsule 100 mg (100 mg Oral Not Given 06/02/19 1557)    ondansetron (ZOFRAN-ODT) disintegrating tablet 4 mg (has no administration in time range)    Or  ondansetron (ZOFRAN) injection 4 mg (has no administration in time range)  methocarbamol (ROBAXIN) tablet 500 mg (500 mg Oral Given 06/02/19 1556)  fentaNYL (SUBLIMAZE) injection 50 mcg (50 mcg Intravenous Given 06/02/19 1401)  midazolam (VERSED) 5 MG/5ML injection (2 mg Intravenous Given 06/02/19 1329)  iohexol (OMNIPAQUE) 300 MG/ML solution 100 mL (100 mLs Intravenous Contrast Given 06/02/19 1357)    ED Course  I have reviewed the triage vital signs and the nursing notes.  Pertinent labs & imaging results that were available during my care of the patient were reviewed by me and considered in my medical decision making (see chart for details).    MDM Rules/Calculators/A&P                      Linda West is a 49 y.o. female with no significant past medical history who presents as a level 2 trauma for right sided torso pain, headache, neck pain after fall from horse.  Patient does not remember the fall and is amnestic to the injury however MS reports that patient was bucked off the horse flying forward  landing on her right side.  They are unsure if she lost consciousness but she does not remember the injury.  She is complaining of severe right-sided chest pain with shortness of breath.  She also has right back and right abdomen pain.  She also has some headache and neck pain.  She denies any blood thinner use and reports her pain is severe.  It is very pleuritic.  EMS was concerned about decreased breath sounds in the right however, vital signs aside from mild tachycardia were reassuring and they did not do right-sided chest decompression in the field.  On arrival, airway was intact.  Breath sounds were decreased on the right however patient was maintaining oxygen saturations on room air and was tachycardic in the 10 5-1 10 range.  She was not hypotensive and otherwise was only complaining of the right  torso pain, headache and neck pain.  No focal neurologic deficits on my exam.  She remains in a c-collar and had some neck tenderness on the right side primarily.  Abdomen was tender on the right side as was her right flank.  Right hip was tender.  No focal neurologic deficits aside from being disoriented to what happened.  Portable chest x-ray does show concern for pneumothorax.  Vital signs aside from tachycardia still remain reassuring.  Trauma was called who will come see the patient and place a chest tube.  Then patient will have CT imaging.  Trauma placed chest tube and she had improvement in her discomfort.  She was also given pain medicine.  CT confirms multiple rib fractures and trauma will admit for further management of traumatic pneumothorax from horse injury.  Patient will be admitted to trauma service for further management.   Final Clinical Impression(s) / ED Diagnoses Final diagnoses:  Trauma  Fall from horse, initial encounter  Traumatic pneumothorax, initial encounter  Closed fracture of multiple ribs of right side, initial encounter    Clinical Impression: 1. Fall from horse, initial encounter   2. Trauma   3. Pneumothorax   4. Traumatic pneumothorax, initial encounter   5. Closed fracture of multiple ribs of right side, initial encounter     Disposition: Admit  This note was prepared with assistance of Dragon voice recognition software. Occasional wrong-word or sound-a-like substitutions may have occurred due to the inherent limitations of voice recognition software.     Dagen Beevers, Gwenyth Allegra, MD 06/02/19 [phone]

## 2019-06-02 NOTE — ED Triage Notes (Signed)
To ED via [facility] from home-- fell off horse-- does not remember incident-- unable to lay flat or on back enroute, on arrival was on left side, position of comfort. Lungs diminished on right side, clear on left

## 2019-06-03 ENCOUNTER — Inpatient Hospital Stay (HOSPITAL_COMMUNITY): Payer: BC Managed Care – PPO

## 2019-06-03 MED ORDER — SERTRALINE HCL 50 MG PO TABS
75.0000 mg | ORAL_TABLET | Freq: Every day | ORAL | Status: DC
  Administered 2019-06-03 – 2019-06-07 (×5): 75 mg via ORAL
  Filled 2019-06-03 (×5): qty 2

## 2019-06-03 MED ORDER — LORAZEPAM 1 MG PO TABS
1.0000 mg | ORAL_TABLET | Freq: Every evening | ORAL | Status: DC | PRN
  Administered 2019-06-06 (×2): 1 mg via ORAL
  Filled 2019-06-03 (×2): qty 2

## 2019-06-03 MED ORDER — POLYETHYLENE GLYCOL 3350 17 G PO PACK
17.0000 g | PACK | Freq: Every day | ORAL | Status: DC | PRN
  Administered 2019-06-05: 17 g via ORAL
  Filled 2019-06-03: qty 1

## 2019-06-03 MED ORDER — FENTANYL CITRATE (PF) 100 MCG/2ML IJ SOLN
25.0000 ug | INTRAMUSCULAR | Status: DC | PRN
  Administered 2019-06-05 (×4): 25 ug via INTRAVENOUS
  Filled 2019-06-03 (×4): qty 2

## 2019-06-03 MED ORDER — METHOCARBAMOL 750 MG PO TABS
750.0000 mg | ORAL_TABLET | Freq: Four times a day (QID) | ORAL | Status: DC
  Administered 2019-06-03 – 2019-06-04 (×3): 750 mg via ORAL
  Filled 2019-06-03 (×3): qty 1

## 2019-06-03 NOTE — Plan of Care (Signed)
  Problem: Education: Goal: Knowledge of General Education information will improve Description Including pain rating scale, medication(s)/side effects and non-pharmacologic comfort measures Outcome: Progressing   

## 2019-06-03 NOTE — Progress Notes (Signed)
[facility] Progress Note     Subjective: CC-  Up in chair, husband at bedside. Still having pain in ribs, but pain medication is helping. Denies cough, phlegm, of SOB. Pulling 1000 on IS. States that the right side of her neck is stiff. No other new injuries noted. 1 episode of emesis last night, states that this happened when she got hot. She has since tolerated a diet and had no further n/v.  Objective: Vital signs in last 24 hours: Temp:  [96.9 F (36.1 C)-98.3 F (36.8 C)] 97.7 F (36.5 C) (05/02 0903) Pulse Rate:  [72-109] 80 (05/02 0903) Resp:  [12-23] 16 (05/02 0903) BP: (98-143)/(64-97) 110/72 (05/02 0903) SpO2:  [95 %-100 %] 97 % (05/02 0903) Weight:  [83.9 kg] 83.9 kg (05/01 1251) Last BM Date: 06/02/19  Intake/Output from previous day: 05/01 0701 - 05/02 0700 In: 635.7 [P.O.:100; I.V.:535.7] Out: -  Intake/Output this shift: Total I/O In: 580 [P.O.:580] Out: -   PE: Gen:  Alert, NAD, pleasant HEENT: EOM's intact, pupils equal and round. C-spine mildly tender right paraspinous muscles/ no SP tenderness/ able to actively range neck but is stiff Card:  RRR, 2+ DP pulses Pulm:  CTAB, no W/R/R, rate and effort normal, pulling 1000 on IS, CT with no air leak Abd: Soft, NT/ND, +BSe Ext:  calves soft and nontender Psych: A&Ox4  Skin: no rashes noted, warm and dry  Lab Results:  Recent Labs    06/02/19 1242 06/02/19 1250  WBC 11.8*  --   HGB 13.9 13.6  HCT 42.9 40.0  PLT 292  --    BMET Recent Labs    06/02/19 1242 06/02/19 1250  NA 139 138  K 3.9 3.8  CL 108 105  CO2 21*  --   GLUCOSE 103* 98  BUN 11 11  CREATININE 1.11* 1.00  CALCIUM 8.9  --    PT/INR Recent Labs    06/02/19 1242  LABPROT 12.8  INR 1.0   CMP     Component Value Date/Time   NA 138 06/02/2019 1250   K 3.8 06/02/2019 1250   CL 105 06/02/2019 1250   CO2 21 (L) 06/02/2019 1242   GLUCOSE 98 06/02/2019 1250   BUN 11 06/02/2019 1250   CREATININE 1.00  06/02/2019 1250   CALCIUM 8.9 06/02/2019 1242   PROT 6.9 06/02/2019 1242   ALBUMIN 3.5 06/02/2019 1242   AST 36 06/02/2019 1242   ALT 29 06/02/2019 1242   ALKPHOS 65 06/02/2019 1242   BILITOT 0.7 06/02/2019 1242   GFRNONAA 59 (L) 06/02/2019 1242   GFRAA >60 06/02/2019 1242   Lipase  No results found for: LIPASE     Studies/Results: CT Head Wo Contrast  Result Date: 06/02/2019 CLINICAL DATA:  49 year old female with trauma, thrown from horse EXAM: CT HEAD WITHOUT CONTRAST CT CERVICAL SPINE WITHOUT CONTRAST TECHNIQUE: Multidetector CT imaging of the head and cervical spine was performed following the standard protocol without intravenous contrast. Multiplanar CT image reconstructions of the cervical spine were also generated. COMPARISON:  None. FINDINGS: CT HEAD FINDINGS Brain: No acute intracranial hemorrhage. No midline shift or mass effect. Gray-white differentiation maintained. Unremarkable appearance of the ventricular system. Vascular: Unremarkable. Skull: No acute fracture.  No aggressive bone lesion identified. Sinuses/Orbits: Unremarkable appearance of the orbits. Mastoid air cells clear. No middle ear effusion. No significant sinus disease. Other: None CT CERVICAL SPINE FINDINGS Alignment: Craniocervical junction aligned. Anatomic alignment of the cervical elements. No subluxation. Skull base and vertebrae: No  acute fracture at the skullbase. Vertebral body heights relatively maintained. No acute fracture identified. Soft tissues and spinal canal: Unremarkable cervical soft tissues. Lymph nodes are present, though not enlarged. Disc levels: Irregular superior endplates of C6 and C7, chronic though without comparison. Anterior osteophyte production at C5-C6 and C6-C7. No bony canal narrowing or foraminal narrowing. Upper chest: Unremarkable appearance of the lung apices. Other: Subcentimeter right-sided thyroid lesions do not require any additional specific follow-up. IMPRESSION: Head CT:  Negative for acute intracranial abnormality. Cervical CT: No acute fracture or malalignment of the cervical spine. Early degenerative changes involving C5-C6 and C6-C7. Electronically Signed   By: Corrie Mckusick D.O.   On: 06/02/2019 14:53   CT Chest W Contrast  Result Date: 06/02/2019 CLINICAL DATA:  49 year old female with a history of trauma EXAM: CT CHEST, ABDOMEN, AND PELVIS WITH CONTRAST TECHNIQUE: Multidetector CT imaging of the chest, abdomen and pelvis was performed following the standard protocol during bolus administration of intravenous contrast. CONTRAST:  129mL OMNIPAQUE IOHEXOL 300 MG/ML  SOLN COMPARISON:  None. FINDINGS: CT CHEST FINDINGS Cardiovascular: Heart size within normal limits. No pericardial fluid/thickening. No significant coronary artery disease. Normal course caliber contour of the thoracic aorta. Branch vessels are patent. No significant atherosclerosis. No periaortic fluid. Unremarkable diameter of the main pulmonary artery. Mediastinum/Nodes: No mediastinal lymphadenopathy. Unremarkable course of the thoracic esophagus. No substernal hematoma. No mediastinal gas. Unremarkable thoracic inlet. Lungs/Pleura: Right-sided thoracostomy tube terminates at the anterior pleural space. Re-expansion of the right lung with trace residual air at the apex. Atelectasis and/or contusion at the dependent right lung base. No pleural effusion. Left lung well aerated without confluent airspace disease. No left pneumothorax or pleural effusion. Musculoskeletal: Nondisplaced rib fractures of the right 5-9 ribs. Vertebral body heights maintained with no acute fracture. No spinous process fracture identified. CT ABDOMEN PELVIS FINDINGS Hepatobiliary: Unremarkable liver.  Unremarkable gallbladder. Pancreas: Unremarkable pancreas Spleen: Unremarkable spleen Adrenals/Urinary Tract: - Right adrenal gland:  Unremarkable - Left adrenal gland: Unremarkable. - Right kidney: No hydronephrosis, nephrolithiasis,  inflammation, or ureteral dilation. No focal lesion. - Left Kidney: No hydronephrosis, nephrolithiasis, inflammation, or ureteral dilation. No focal lesion. - Urinary Bladder: Unremarkable. Stomach/Bowel: - Stomach: Unremarkable. - Small bowel: Unremarkable - Appendix: Normal. - Colon: Unremarkable. Vascular/Lymphatic: No significant atherosclerotic changes. Unremarkable course caliber contour of the abdominal aorta, mesenteric arteries, renal arteries. Unremarkable bilateral iliac arteries. Proximal femoral arteries are patent. Unremarkable systemic venous system and portal venous system. No adenopathy Reproductive: Hysterectomy Other: None Musculoskeletal: Vertebral body heights maintained. No fracture of the lumbar vertebral bodies. No bony canal narrowing. Bilateral hips congruent. No pelvic fracture. No transverse process fracture identified IMPRESSION: Right-sided pigtail thoracostomy tube terminates in the anterior pleural space with near complete re-expansion of the right lung compared to prior plain film. Dependent right lung atelectasis/contusion. Nondisplaced rib fractures of the right 5-9 ribs. Negative CT for evidence of thoracic or lumbar spine fracture. Preliminary results were discussed in person at the time of interpretation on 06/02/2019 at 2:14 pm with Dr. Donne Hazel. Electronically Signed   By: Corrie Mckusick D.O.   On: 06/02/2019 14:16   CT Cervical Spine Wo Contrast  Result Date: 06/02/2019 CLINICAL DATA:  49 year old female with trauma, thrown from horse EXAM: CT HEAD WITHOUT CONTRAST CT CERVICAL SPINE WITHOUT CONTRAST TECHNIQUE: Multidetector CT imaging of the head and cervical spine was performed following the standard protocol without intravenous contrast. Multiplanar CT image reconstructions of the cervical spine were also generated. COMPARISON:  None. FINDINGS: CT HEAD FINDINGS  Brain: No acute intracranial hemorrhage. No midline shift or mass effect. Gray-white differentiation maintained.  Unremarkable appearance of the ventricular system. Vascular: Unremarkable. Skull: No acute fracture.  No aggressive bone lesion identified. Sinuses/Orbits: Unremarkable appearance of the orbits. Mastoid air cells clear. No middle ear effusion. No significant sinus disease. Other: None CT CERVICAL SPINE FINDINGS Alignment: Craniocervical junction aligned. Anatomic alignment of the cervical elements. No subluxation. Skull base and vertebrae: No acute fracture at the skullbase. Vertebral body heights relatively maintained. No acute fracture identified. Soft tissues and spinal canal: Unremarkable cervical soft tissues. Lymph nodes are present, though not enlarged. Disc levels: Irregular superior endplates of C6 and C7, chronic though without comparison. Anterior osteophyte production at C5-C6 and C6-C7. No bony canal narrowing or foraminal narrowing. Upper chest: Unremarkable appearance of the lung apices. Other: Subcentimeter right-sided thyroid lesions do not require any additional specific follow-up. IMPRESSION: Head CT: Negative for acute intracranial abnormality. Cervical CT: No acute fracture or malalignment of the cervical spine. Early degenerative changes involving C5-C6 and C6-C7. Electronically Signed   By: Corrie Mckusick D.O.   On: 06/02/2019 14:53   CT ABDOMEN PELVIS W CONTRAST  Result Date: 06/02/2019 CLINICAL DATA:  49 year old female with a history of trauma EXAM: CT CHEST, ABDOMEN, AND PELVIS WITH CONTRAST TECHNIQUE: Multidetector CT imaging of the chest, abdomen and pelvis was performed following the standard protocol during bolus administration of intravenous contrast. CONTRAST:  17mL OMNIPAQUE IOHEXOL 300 MG/ML  SOLN COMPARISON:  None. FINDINGS: CT CHEST FINDINGS Cardiovascular: Heart size within normal limits. No pericardial fluid/thickening. No significant coronary artery disease. Normal course caliber contour of the thoracic aorta. Branch vessels are patent. No significant atherosclerosis. No  periaortic fluid. Unremarkable diameter of the main pulmonary artery. Mediastinum/Nodes: No mediastinal lymphadenopathy. Unremarkable course of the thoracic esophagus. No substernal hematoma. No mediastinal gas. Unremarkable thoracic inlet. Lungs/Pleura: Right-sided thoracostomy tube terminates at the anterior pleural space. Re-expansion of the right lung with trace residual air at the apex. Atelectasis and/or contusion at the dependent right lung base. No pleural effusion. Left lung well aerated without confluent airspace disease. No left pneumothorax or pleural effusion. Musculoskeletal: Nondisplaced rib fractures of the right 5-9 ribs. Vertebral body heights maintained with no acute fracture. No spinous process fracture identified. CT ABDOMEN PELVIS FINDINGS Hepatobiliary: Unremarkable liver.  Unremarkable gallbladder. Pancreas: Unremarkable pancreas Spleen: Unremarkable spleen Adrenals/Urinary Tract: - Right adrenal gland:  Unremarkable - Left adrenal gland: Unremarkable. - Right kidney: No hydronephrosis, nephrolithiasis, inflammation, or ureteral dilation. No focal lesion. - Left Kidney: No hydronephrosis, nephrolithiasis, inflammation, or ureteral dilation. No focal lesion. - Urinary Bladder: Unremarkable. Stomach/Bowel: - Stomach: Unremarkable. - Small bowel: Unremarkable - Appendix: Normal. - Colon: Unremarkable. Vascular/Lymphatic: No significant atherosclerotic changes. Unremarkable course caliber contour of the abdominal aorta, mesenteric arteries, renal arteries. Unremarkable bilateral iliac arteries. Proximal femoral arteries are patent. Unremarkable systemic venous system and portal venous system. No adenopathy Reproductive: Hysterectomy Other: None Musculoskeletal: Vertebral body heights maintained. No fracture of the lumbar vertebral bodies. No bony canal narrowing. Bilateral hips congruent. No pelvic fracture. No transverse process fracture identified IMPRESSION: Right-sided pigtail thoracostomy  tube terminates in the anterior pleural space with near complete re-expansion of the right lung compared to prior plain film. Dependent right lung atelectasis/contusion. Nondisplaced rib fractures of the right 5-9 ribs. Negative CT for evidence of thoracic or lumbar spine fracture. Preliminary results were discussed in person at the time of interpretation on 06/02/2019 at 2:14 pm with Dr. Donne Hazel. Electronically Signed   By: York Cerise  Earleen Newport D.O.   On: 06/02/2019 14:16   DG Pelvis Portable  Result Date: 06/02/2019 CLINICAL DATA:  Fall from horse EXAM: PORTABLE PELVIS 1-2 VIEWS COMPARISON:  None. FINDINGS: There is no evidence of pelvic fracture or diastasis. No pelvic bone lesions are seen. IMPRESSION: No displaced fracture of the pelvis or bilateral proximal femurs on single frontal radiograph. Electronically Signed   By: Eddie Candle M.D.   On: 06/02/2019 13:59   DG Chest Port 1 View  Result Date: 06/03/2019 CLINICAL DATA:  Fall force, RIGHT rib fractures, RIGHT pneumothorax EXAM: PORTABLE CHEST 1 VIEW COMPARISON:  Portable exam 0608 hours compared to 06/02/2019 FINDINGS: Interval placement of pigtail RIGHT thoracostomy tube. Near complete resolution of previously seen RIGHT pneumothorax, with minimal residual at the lateral upper RIGHT chest. Normal heart size, mediastinal contours, and pulmonary vascularity. Mild RIGHT basilar atelectasis. No pleural effusion identified. Fractures of the posterolateral RIGHT fifth and sixth ribs noted. IMPRESSION: Near complete resolution of previously identified RIGHT pneumothorax post thoracostomy tube placement. Electronically Signed   By: Lavonia Dana M.D.   On: 06/03/2019 09:08   DG Chest Port 1 View  Result Date: 06/02/2019 CLINICAL DATA:  49 year old female with injury, fall from horse EXAM: PORTABLE CHEST 1 VIEW COMPARISON:  None. FINDINGS: Cardiomediastinal silhouette within normal limits in size and contour. Left lung without confluent airspace disease  pneumothorax or pleural effusion. Right-sided pneumothorax. Nondisplaced rib fracture of the posterior fifth rib with questionable nondisplaced rib fractures of the sixth and seventh ribs. Using Collins method of % PTX = 4.2 + 4.7 * (A+B+C) where A =3.4cm, B =2.1cm, C = 1.2cm percentage is estimated 36%. IMPRESSION: Right-sided pneumothorax, estimated 36% using Collins method. Critical Value/emergent results were discussed in person at the time of interpretation on 06/02/2019 at 2:05 pm with Dr. Rolm Bookbinder at the time of interpretation. Nondisplaced right fifth rib fracture with questionable fractures of ribs 6 and 7. Electronically Signed   By: Corrie Mckusick D.O.   On: 06/02/2019 14:05    Anti-infectives: Anti-infectives (From admission, onward)   None       Assessment/Plan Fall off horse Right PTX/right rib fractures 5-9- s/p Chest Tube. Water seal tube today and repeat CXR in AM. Continue pulm toilet AKI - mild, Cr 1.11, repeat BMP in AM Neck pain - CT neg, no SP tenderness and able to actively range neck. Likely a strain, continue heat PRN Anxiety - home meds reordered  ID - none FEN - KVO IVF, reg diet, bowel regimen VTE - SCDs, lovenox Foley - none Follow up - trauma  Plan - Water seal CT. PT consult. Increase robaxin to 750mg  q6hr.   LOS: 1 day    Wellington Hampshire, [facility] 06/03/2019, 12:24 PM Please see Amion for pager number during day hours 7:00am-4:30pm

## 2019-06-03 NOTE — Plan of Care (Signed)
  Problem: Education: Goal: Knowledge of General Education information will improve Description: Including pain rating scale, medication(s)/side effects and non-pharmacologic comfort measures Outcome: Progressing   Problem: Education: Goal: Knowledge of General Education information will improve Description: Including pain rating scale, medication(s)/side effects and non-pharmacologic comfort measures Outcome: Progressing   Problem: Health Behavior/Discharge Planning: Goal: Ability to manage health-related needs will improve Outcome: Progressing   Problem: Activity: Goal: Risk for activity intolerance will decrease Outcome: Progressing   Problem: Nutrition: Goal: Adequate nutrition will be maintained Outcome: Progressing   Problem: Pain Managment: Goal: General experience of comfort will improve Outcome: Progressing   Problem: Safety: Goal: Ability to remain free from injury will improve Outcome: Progressing

## 2019-06-04 ENCOUNTER — Inpatient Hospital Stay (HOSPITAL_COMMUNITY): Payer: BC Managed Care – PPO

## 2019-06-04 LAB — BASIC METABOLIC PANEL
Anion gap: 5 (ref 5–15)
BUN: 11 mg/dL (ref 6–20)
CO2: 26 mmol/L (ref 22–32)
Calcium: 8.3 mg/dL — ABNORMAL LOW (ref 8.9–10.3)
Chloride: 107 mmol/L (ref 98–111)
Creatinine, Ser: 1.08 mg/dL — ABNORMAL HIGH (ref 0.44–1.00)
GFR calc Af Amer: >60 mL/min (ref >60)
GFR calc non Af Amer: >60 mL/min (ref >60)
Glucose, Bld: 100 mg/dL — ABNORMAL HIGH (ref 70–99)
Potassium: 4.1 mmol/L (ref 3.5–5.1)
Sodium: 138 mmol/L (ref 135–145)

## 2019-06-04 LAB — CBC
HCT: 36.6 % (ref 36.0–46.0)
Hemoglobin: 11.8 g/dL — ABNORMAL LOW (ref 12.0–15.0)
MCH: 29.9 pg (ref 26.0–34.0)
MCHC: 32.2 g/dL (ref 30.0–36.0)
MCV: 92.7 fL (ref 80.0–100.0)
Platelets: 244 10*3/uL (ref 150–400)
RBC: 3.95 MIL/uL (ref 3.87–5.11)
RDW: 12.7 % (ref 11.5–15.5)
WBC: 9.1 10*3/uL (ref 4.0–10.5)
nRBC: 0 % (ref 0.0–0.2)

## 2019-06-04 MED ORDER — SODIUM CHLORIDE 0.9 % IV BOLUS
500.0000 mL | Freq: Once | INTRAVENOUS | Status: AC
  Administered 2019-06-04: 500 mL via INTRAVENOUS

## 2019-06-04 MED ORDER — ACETAMINOPHEN 500 MG PO TABS
1000.0000 mg | ORAL_TABLET | Freq: Three times a day (TID) | ORAL | Status: DC
  Administered 2019-06-04 – 2019-06-07 (×8): 1000 mg via ORAL
  Filled 2019-06-04 (×8): qty 2

## 2019-06-04 MED ORDER — LIDOCAINE 5 % EX PTCH
1.0000 | MEDICATED_PATCH | CUTANEOUS | Status: DC
  Administered 2019-06-04: 1 via TRANSDERMAL
  Filled 2019-06-04 (×2): qty 1

## 2019-06-04 MED ORDER — OXYCODONE HCL 5 MG PO TABS
5.0000 mg | ORAL_TABLET | ORAL | Status: DC | PRN
  Administered 2019-06-04 – 2019-06-07 (×11): 10 mg via ORAL
  Filled 2019-06-04: qty 1
  Filled 2019-06-04 (×3): qty 2
  Filled 2019-06-04: qty 1
  Filled 2019-06-04 (×7): qty 2

## 2019-06-04 MED ORDER — METHOCARBAMOL 500 MG PO TABS
1000.0000 mg | ORAL_TABLET | Freq: Four times a day (QID) | ORAL | Status: DC
  Administered 2019-06-04 – 2019-06-07 (×13): 1000 mg via ORAL
  Filled 2019-06-04 (×13): qty 2

## 2019-06-04 NOTE — Social Work (Signed)
CSW met with pt at bedside. CSW introduced self and explained her role. CSW completed sbirt with pt.  Pt scored a 0 on the sbirt scale. Pt denied alcohol use. Pt denied substance use. Pt did not need resources at this time.  Emeterio Reeve, Latanya Presser, [place] Social Worker [phone]

## 2019-06-04 NOTE — Evaluation (Signed)
Physical Therapy Evaluation Patient Details Name: Linda West MRN: [id number] DOB: [birth year] Today's Date: 06/04/2019   History of Present Illness  Pt is a 49 y.o. female who presented to the ED following a fall from a horse. She sustained R pneumothorax and R rib fxs 5-9.    Clinical Impression  Pt admitted with above diagnosis. On eval, pt required min assist bed mobility, min guard assist transfers and min guard assist ambulation 15' with RW. Husband present and engaged in session.  Pt currently with functional limitations due to the deficits listed below (see PT Problem List). Pt will benefit from skilled PT to increase their independence and safety with mobility to allow discharge to the venue listed below.  PT to follow acutely. No follow up services indicated. She has good family support that can provide needed level of assist. Pt and husband have good understanding of mobility progression. Pt has all needed DME.      Follow Up Recommendations No PT follow up;Supervision/Assistance - 24 hour    Equipment Recommendations  None recommended by PT    Recommendations for Other Services       Precautions / Restrictions Precautions Precautions: Other (comment) Precaution Comments: chest tube (continuous suction)      Mobility  Bed Mobility Overal bed mobility: Needs Assistance Bed Mobility: Supine to Sit     Supine to sit: Min assist;HOB elevated     General bed mobility comments: assist to elevate trunk  Transfers Overall transfer level: Needs assistance Equipment used: Rolling walker (2 wheeled) Transfers: Sit to/from Stand Sit to Stand: Min guard         General transfer comment: increased time, min guard for safety  Ambulation/Gait Ambulation/Gait assistance: Min guard Gait Distance (Feet): 15 Feet Assistive device: Rolling walker (2 wheeled) Gait Pattern/deviations: Step-through pattern;Decreased stride length Gait velocity: decreased   General Gait  Details: slow, steady gait with RW; min guard assist for safety  Stairs            Wheelchair Mobility    Modified Rankin (Stroke Patients Only)       Balance Overall balance assessment: Mild deficits observed, not formally tested                                           Pertinent Vitals/Pain Pain Assessment: 0-10 Pain Score: 6  Pain Location: R flank Pain Descriptors / Indicators: Grimacing;Guarding;Discomfort Pain Intervention(s): Limited activity within patient's tolerance;Repositioned;Monitored during session;Premedicated before session    Home Living Family/patient expects to be discharged to:: Private residence Living Arrangements: Spouse/significant other Available Help at Discharge: Family;Available 24 hours/day Type of Home: House Home Access: Stairs to enter   [employer] of Steps: 1 Home Layout: One level Home Equipment: Walker - 2 wheels;Cane - quad;Cane - single point;Bedside commode;Shower seat Additional Comments: equipment is from pt's grandmother who passed away    Prior Function Level of Independence: Independent               Hand Dominance        Extremity/Trunk Assessment   Upper Extremity Assessment Upper Extremity Assessment: (limited due to pain)    Lower Extremity Assessment Lower Extremity Assessment: Overall WFL for tasks assessed    Cervical / Trunk Assessment Cervical / Trunk Assessment: Normal  Communication   Communication: No difficulties  Cognition Arousal/Alertness: Awake/alert Behavior During Therapy: WFL for tasks assessed/performed Overall  Cognitive Status: Within Functional Limits for tasks assessed                                        General Comments General comments (skin integrity, edema, etc.): VSS on RA (SpO2 96%)    Exercises     Assessment/Plan    PT Assessment Patient needs continued PT services  PT Problem List Decreased  mobility;Cardiopulmonary status limiting activity;Decreased activity tolerance;Pain;Decreased knowledge of use of DME       PT Treatment Interventions DME instruction;Therapeutic activities;Gait training;Therapeutic exercise;Patient/family education;Stair training;Functional mobility training;Balance training    PT Goals (Current goals can be found in the Care Plan section)  Acute Rehab PT Goals Patient Stated Goal: home PT Goal Formulation: With patient/family Time For Goal Achievement: 06/18/19 Potential to Achieve Goals: Good    Frequency Min 3X/week   Barriers to discharge        Co-evaluation               AM-PAC PT "6 Clicks" Mobility  Outcome Measure Help needed turning from your back to your side while in a flat bed without using bedrails?: A Little Help needed moving from lying on your back to sitting on the side of a flat bed without using bedrails?: A Little Help needed moving to and from a bed to a chair (including a wheelchair)?: A Little Help needed standing up from a chair using your arms (e.g., wheelchair or bedside chair)?: None Help needed to walk in hospital room?: A Little Help needed climbing 3-5 steps with a railing? : A Little 6 Click Score: 19    End of Session   Activity Tolerance: Patient tolerated treatment well Patient left: in chair;with call bell/phone within reach;with family/visitor present Nurse Communication: Mobility status PT Visit Diagnosis: Pain;Difficulty in walking, not elsewhere classified (R26.2) Pain - Right/Left: Right    Time: [id number] PT Time Calculation (min) (ACUTE ONLY): 24 min   Charges:   PT Evaluation $PT Eval Moderate Complexity: 1 Mod PT Treatments $Gait Training: 8-22 mins        Lorrin Goodell, PT  Office # [phone] Pager [phone]   Lorriane Shire 06/04/2019, 12:50 PM

## 2019-06-04 NOTE — TOC Initial Note (Signed)
Transition of Care [facility]) - Initial/Assessment Note    Patient Details  Name: Linda West MRN: [id number] Date of Birth: 05-Jul-1970  Transition of Care [facility] CM/SW Contact:    Ella Bodo, RN Phone Number: 06/04/2019, 5:09 PM  Clinical Narrative:  Pt is a 49 y.o. female who presented to the ED following a fall from a horse. She sustained R pneumothorax and R rib fxs 5-9.  PTA, pt independent, lives with spouse, who can provide 24h assistance at dc.  PT recommending no OP follow up; OT recs pending.  Will follow progress.  Pt's PCP is Dr. Adriana Reams.                Expected Discharge Plan: Home/Self Care Barriers to Discharge: Continued Medical Work up   Patient Goals and CMS Choice        Expected Discharge Plan and Services Expected Discharge Plan: Home/Self Care   Discharge Planning Services: CM Consult   Living arrangements for the past 2 months: Single Family Home                                      Prior Living Arrangements/Services Living arrangements for the past 2 months: Single Family Home Lives with:: Spouse Patient language and need for interpreter reviewed:: Yes Do you feel safe going back to the place where you live?: Yes      Need for Family Participation in Patient Care: Yes (Comment) Care giver support system in place?: Yes (comment)   Criminal Activity/Legal Involvement Pertinent to Current Situation/Hospitalization: No - Comment as needed  Activities of Daily Living Home Assistive Devices/Equipment: None ADL Screening (condition at time of admission) Patient's cognitive ability adequate to safely complete daily activities?: Yes Is the patient deaf or have difficulty hearing?: No Does the patient have difficulty seeing, even when wearing glasses/contacts?: No Does the patient have difficulty concentrating, remembering, or making decisions?: No Patient able to express need for assistance with ADLs?: Yes Does the patient have  difficulty dressing or bathing?: No Independently performs ADLs?: Yes (appropriate for developmental age) Does the patient have difficulty walking or climbing stairs?: No Weakness of Legs: None Weakness of Arms/Hands: None  Permission Sought/Granted                  Emotional Assessment Appearance:: Appears stated age Attitude/Demeanor/Rapport: Guarded Affect (typically observed): Appropriate Orientation: : Oriented to Self, Oriented to Place, Oriented to  Time, Oriented to Situation      Admission diagnosis:  Trauma [T14.90XA] Fall from horse [V80.010A] Pneumothorax [J93.9] Patient Active Problem List   Diagnosis Date Noted  . Fall from horse 06/02/2019   PCP:  Curly Rim, MD Pharmacy:   [facility] [place], [address] [place] [address] Phone: [phone] Fax: [phone]     Social Determinants of Health (SDOH) Interventions    Readmission Risk Interventions No flowsheet data found.   Reinaldo Raddle, RN, BSN  Trauma/Neuro ICU Case Manager [phone]

## 2019-06-04 NOTE — Progress Notes (Signed)
[facility] Progress Note     Subjective: Patient complaining of pain in R chest with movement. Denies SOB. Coughing some yesterday. Pulling IS to 1000. Patient reports she has not been able to sleep, takes ativan at home and has this ordered qhs prn. Husband at bedside.   Review of Systems  Constitutional: Negative for chills and fever.  Respiratory: Positive for cough. Negative for shortness of breath and wheezing.   Cardiovascular: Positive for chest pain (L ribs).  Gastrointestinal: Negative for abdominal pain, nausea and vomiting.  Genitourinary: Negative for dysuria, frequency and urgency.  Musculoskeletal: Positive for neck pain.  Psychiatric/Behavioral: The patient has insomnia.      Objective: Vital signs in last 24 hours: Temp:  [97.7 F (36.5 C)-98.1 F (36.7 C)] 97.7 F (36.5 C) (05/03 0414) Pulse Rate:  [69-79] 79 (05/03 0414) Resp:  [14-18] 14 (05/03 0414) BP: (112-127)/(76-82) 127/82 (05/03 0414) SpO2:  [95 %-98 %] 95 % (05/03 0414) Last BM Date: 06/03/19  Intake/Output from previous day: 05/02 0701 - 05/03 0700 In: 2100.5 [P.O.:1640; I.V.:460.5] Out: 0  Intake/Output this shift: No intake/output data recorded.  PE: General: pleasant, WD, WN female who is laying in bed in NAD HEENT: head is normocephalic, atraumatic.  Sclera are noninjected.  PERRL.  Ears and nose without any masses or lesions.  Mouth is pink and moist Heart: regular, rate, and rhythm.  Normal s1,s2. No obvious murmurs, gallops, or rubs noted.  Palpable radial and pedal pulses bilaterally Lungs: slightly diminished breath sounds over R anterior lung fields.  Respiratory effort nonlabored. R CT in place with sanguinous drainage in tubing, no air leak Abd: soft, NT, ND, +BS, no masses, hernias, or organomegaly MS: all 4 extremities are symmetrical with no cyanosis, clubbing, or edema. Skin: warm and dry with no masses, lesions, or rashes Neuro: Cranial nerves 2-12 grossly intact,  speech is normal Psych: A&Ox3 with an appropriate affect.   Lab Results:  Recent Labs    06/02/19 1242 06/02/19 1242 06/02/19 1250 06/04/19 0153  WBC 11.8*  --   --  9.1  HGB 13.9   < > 13.6 11.8*  HCT 42.9   < > 40.0 36.6  PLT 292  --   --  244   < > = values in this interval not displayed.   BMET Recent Labs    06/02/19 1242 06/02/19 1242 06/02/19 1250 06/04/19 0153  NA 139   < > 138 138  K 3.9   < > 3.8 4.1  CL 108   < > 105 107  CO2 21*  --   --  26  GLUCOSE 103*   < > 98 100*  BUN 11   < > 11 11  CREATININE 1.11*   < > 1.00 1.08*  CALCIUM 8.9  --   --  8.3*   < > = values in this interval not displayed.   PT/INR Recent Labs    06/02/19 1242  LABPROT 12.8  INR 1.0   CMP     Component Value Date/Time   NA 138 06/04/2019 0153   K 4.1 06/04/2019 0153   CL 107 06/04/2019 0153   CO2 26 06/04/2019 0153   GLUCOSE 100 (H) 06/04/2019 0153   BUN 11 06/04/2019 0153   CREATININE 1.08 (H) 06/04/2019 0153   CALCIUM 8.3 (L) 06/04/2019 0153   PROT 6.9 06/02/2019 1242   ALBUMIN 3.5 06/02/2019 1242   AST 36 06/02/2019 1242   ALT 29 06/02/2019 1242  ALKPHOS 65 06/02/2019 1242   BILITOT 0.7 06/02/2019 1242   GFRNONAA >60 06/04/2019 0153   GFRAA >60 06/04/2019 0153   Lipase  No results found for: LIPASE     Studies/Results: CT Head Wo Contrast  Result Date: 06/02/2019 CLINICAL DATA:  49 year old female with trauma, thrown from horse EXAM: CT HEAD WITHOUT CONTRAST CT CERVICAL SPINE WITHOUT CONTRAST TECHNIQUE: Multidetector CT imaging of the head and cervical spine was performed following the standard protocol without intravenous contrast. Multiplanar CT image reconstructions of the cervical spine were also generated. COMPARISON:  None. FINDINGS: CT HEAD FINDINGS Brain: No acute intracranial hemorrhage. No midline shift or mass effect. Gray-white differentiation maintained. Unremarkable appearance of the ventricular system. Vascular: Unremarkable. Skull: No acute  fracture.  No aggressive bone lesion identified. Sinuses/Orbits: Unremarkable appearance of the orbits. Mastoid air cells clear. No middle ear effusion. No significant sinus disease. Other: None CT CERVICAL SPINE FINDINGS Alignment: Craniocervical junction aligned. Anatomic alignment of the cervical elements. No subluxation. Skull base and vertebrae: No acute fracture at the skullbase. Vertebral body heights relatively maintained. No acute fracture identified. Soft tissues and spinal canal: Unremarkable cervical soft tissues. Lymph nodes are present, though not enlarged. Disc levels: Irregular superior endplates of C6 and C7, chronic though without comparison. Anterior osteophyte production at C5-C6 and C6-C7. No bony canal narrowing or foraminal narrowing. Upper chest: Unremarkable appearance of the lung apices. Other: Subcentimeter right-sided thyroid lesions do not require any additional specific follow-up. IMPRESSION: Head CT: Negative for acute intracranial abnormality. Cervical CT: No acute fracture or malalignment of the cervical spine. Early degenerative changes involving C5-C6 and C6-C7. Electronically Signed   By: Corrie Mckusick D.O.   On: 06/02/2019 14:53   CT Chest W Contrast  Result Date: 06/02/2019 CLINICAL DATA:  49 year old female with a history of trauma EXAM: CT CHEST, ABDOMEN, AND PELVIS WITH CONTRAST TECHNIQUE: Multidetector CT imaging of the chest, abdomen and pelvis was performed following the standard protocol during bolus administration of intravenous contrast. CONTRAST:  127mL OMNIPAQUE IOHEXOL 300 MG/ML  SOLN COMPARISON:  None. FINDINGS: CT CHEST FINDINGS Cardiovascular: Heart size within normal limits. No pericardial fluid/thickening. No significant coronary artery disease. Normal course caliber contour of the thoracic aorta. Branch vessels are patent. No significant atherosclerosis. No periaortic fluid. Unremarkable diameter of the main pulmonary artery. Mediastinum/Nodes: No  mediastinal lymphadenopathy. Unremarkable course of the thoracic esophagus. No substernal hematoma. No mediastinal gas. Unremarkable thoracic inlet. Lungs/Pleura: Right-sided thoracostomy tube terminates at the anterior pleural space. Re-expansion of the right lung with trace residual air at the apex. Atelectasis and/or contusion at the dependent right lung base. No pleural effusion. Left lung well aerated without confluent airspace disease. No left pneumothorax or pleural effusion. Musculoskeletal: Nondisplaced rib fractures of the right 5-9 ribs. Vertebral body heights maintained with no acute fracture. No spinous process fracture identified. CT ABDOMEN PELVIS FINDINGS Hepatobiliary: Unremarkable liver.  Unremarkable gallbladder. Pancreas: Unremarkable pancreas Spleen: Unremarkable spleen Adrenals/Urinary Tract: - Right adrenal gland:  Unremarkable - Left adrenal gland: Unremarkable. - Right kidney: No hydronephrosis, nephrolithiasis, inflammation, or ureteral dilation. No focal lesion. - Left Kidney: No hydronephrosis, nephrolithiasis, inflammation, or ureteral dilation. No focal lesion. - Urinary Bladder: Unremarkable. Stomach/Bowel: - Stomach: Unremarkable. - Small bowel: Unremarkable - Appendix: Normal. - Colon: Unremarkable. Vascular/Lymphatic: No significant atherosclerotic changes. Unremarkable course caliber contour of the abdominal aorta, mesenteric arteries, renal arteries. Unremarkable bilateral iliac arteries. Proximal femoral arteries are patent. Unremarkable systemic venous system and portal venous system. No adenopathy Reproductive: Hysterectomy Other: None  Musculoskeletal: Vertebral body heights maintained. No fracture of the lumbar vertebral bodies. No bony canal narrowing. Bilateral hips congruent. No pelvic fracture. No transverse process fracture identified IMPRESSION: Right-sided pigtail thoracostomy tube terminates in the anterior pleural space with near complete re-expansion of the right  lung compared to prior plain film. Dependent right lung atelectasis/contusion. Nondisplaced rib fractures of the right 5-9 ribs. Negative CT for evidence of thoracic or lumbar spine fracture. Preliminary results were discussed in person at the time of interpretation on 06/02/2019 at 2:14 pm with Dr. Donne Hazel. Electronically Signed   By: Corrie Mckusick D.O.   On: 06/02/2019 14:16   CT Cervical Spine Wo Contrast  Result Date: 06/02/2019 CLINICAL DATA:  49 year old female with trauma, thrown from horse EXAM: CT HEAD WITHOUT CONTRAST CT CERVICAL SPINE WITHOUT CONTRAST TECHNIQUE: Multidetector CT imaging of the head and cervical spine was performed following the standard protocol without intravenous contrast. Multiplanar CT image reconstructions of the cervical spine were also generated. COMPARISON:  None. FINDINGS: CT HEAD FINDINGS Brain: No acute intracranial hemorrhage. No midline shift or mass effect. Gray-white differentiation maintained. Unremarkable appearance of the ventricular system. Vascular: Unremarkable. Skull: No acute fracture.  No aggressive bone lesion identified. Sinuses/Orbits: Unremarkable appearance of the orbits. Mastoid air cells clear. No middle ear effusion. No significant sinus disease. Other: None CT CERVICAL SPINE FINDINGS Alignment: Craniocervical junction aligned. Anatomic alignment of the cervical elements. No subluxation. Skull base and vertebrae: No acute fracture at the skullbase. Vertebral body heights relatively maintained. No acute fracture identified. Soft tissues and spinal canal: Unremarkable cervical soft tissues. Lymph nodes are present, though not enlarged. Disc levels: Irregular superior endplates of C6 and C7, chronic though without comparison. Anterior osteophyte production at C5-C6 and C6-C7. No bony canal narrowing or foraminal narrowing. Upper chest: Unremarkable appearance of the lung apices. Other: Subcentimeter right-sided thyroid lesions do not require any additional  specific follow-up. IMPRESSION: Head CT: Negative for acute intracranial abnormality. Cervical CT: No acute fracture or malalignment of the cervical spine. Early degenerative changes involving C5-C6 and C6-C7. Electronically Signed   By: Corrie Mckusick D.O.   On: 06/02/2019 14:53   CT ABDOMEN PELVIS W CONTRAST  Result Date: 06/02/2019 CLINICAL DATA:  49 year old female with a history of trauma EXAM: CT CHEST, ABDOMEN, AND PELVIS WITH CONTRAST TECHNIQUE: Multidetector CT imaging of the chest, abdomen and pelvis was performed following the standard protocol during bolus administration of intravenous contrast. CONTRAST:  186mL OMNIPAQUE IOHEXOL 300 MG/ML  SOLN COMPARISON:  None. FINDINGS: CT CHEST FINDINGS Cardiovascular: Heart size within normal limits. No pericardial fluid/thickening. No significant coronary artery disease. Normal course caliber contour of the thoracic aorta. Branch vessels are patent. No significant atherosclerosis. No periaortic fluid. Unremarkable diameter of the main pulmonary artery. Mediastinum/Nodes: No mediastinal lymphadenopathy. Unremarkable course of the thoracic esophagus. No substernal hematoma. No mediastinal gas. Unremarkable thoracic inlet. Lungs/Pleura: Right-sided thoracostomy tube terminates at the anterior pleural space. Re-expansion of the right lung with trace residual air at the apex. Atelectasis and/or contusion at the dependent right lung base. No pleural effusion. Left lung well aerated without confluent airspace disease. No left pneumothorax or pleural effusion. Musculoskeletal: Nondisplaced rib fractures of the right 5-9 ribs. Vertebral body heights maintained with no acute fracture. No spinous process fracture identified. CT ABDOMEN PELVIS FINDINGS Hepatobiliary: Unremarkable liver.  Unremarkable gallbladder. Pancreas: Unremarkable pancreas Spleen: Unremarkable spleen Adrenals/Urinary Tract: - Right adrenal gland:  Unremarkable - Left adrenal gland: Unremarkable. -  Right kidney: No hydronephrosis, nephrolithiasis, inflammation, or ureteral  dilation. No focal lesion. - Left Kidney: No hydronephrosis, nephrolithiasis, inflammation, or ureteral dilation. No focal lesion. - Urinary Bladder: Unremarkable. Stomach/Bowel: - Stomach: Unremarkable. - Small bowel: Unremarkable - Appendix: Normal. - Colon: Unremarkable. Vascular/Lymphatic: No significant atherosclerotic changes. Unremarkable course caliber contour of the abdominal aorta, mesenteric arteries, renal arteries. Unremarkable bilateral iliac arteries. Proximal femoral arteries are patent. Unremarkable systemic venous system and portal venous system. No adenopathy Reproductive: Hysterectomy Other: None Musculoskeletal: Vertebral body heights maintained. No fracture of the lumbar vertebral bodies. No bony canal narrowing. Bilateral hips congruent. No pelvic fracture. No transverse process fracture identified IMPRESSION: Right-sided pigtail thoracostomy tube terminates in the anterior pleural space with near complete re-expansion of the right lung compared to prior plain film. Dependent right lung atelectasis/contusion. Nondisplaced rib fractures of the right 5-9 ribs. Negative CT for evidence of thoracic or lumbar spine fracture. Preliminary results were discussed in person at the time of interpretation on 06/02/2019 at 2:14 pm with Dr. Donne Hazel. Electronically Signed   By: Corrie Mckusick D.O.   On: 06/02/2019 14:16   DG Pelvis Portable  Result Date: 06/02/2019 CLINICAL DATA:  Fall from horse EXAM: PORTABLE PELVIS 1-2 VIEWS COMPARISON:  None. FINDINGS: There is no evidence of pelvic fracture or diastasis. No pelvic bone lesions are seen. IMPRESSION: No displaced fracture of the pelvis or bilateral proximal femurs on single frontal radiograph. Electronically Signed   By: Eddie Candle M.D.   On: 06/02/2019 13:59   DG CHEST PORT 1 VIEW  Result Date: 06/04/2019 CLINICAL DATA:  Chest tube. EXAM: PORTABLE CHEST 1 VIEW COMPARISON:   Jun 03, 2019. FINDINGS: The heart size and mediastinal contours are within normal limits. Right-sided chest tube is unchanged in position. Left lung is clear. Small right apical pneumothorax is noted which is enlarged compared to prior exam. The visualized skeletal structures are unremarkable. IMPRESSION: Small right apical pneumothorax is noted which is enlarged compared to prior exam. Right-sided chest tube is unchanged in position. Electronically Signed   By: Marijo Conception M.D.   On: 06/04/2019 08:17   DG Chest Port 1 View  Result Date: 06/03/2019 CLINICAL DATA:  Fall force, RIGHT rib fractures, RIGHT pneumothorax EXAM: PORTABLE CHEST 1 VIEW COMPARISON:  Portable exam 0608 hours compared to 06/02/2019 FINDINGS: Interval placement of pigtail RIGHT thoracostomy tube. Near complete resolution of previously seen RIGHT pneumothorax, with minimal residual at the lateral upper RIGHT chest. Normal heart size, mediastinal contours, and pulmonary vascularity. Mild RIGHT basilar atelectasis. No pleural effusion identified. Fractures of the posterolateral RIGHT fifth and sixth ribs noted. IMPRESSION: Near complete resolution of previously identified RIGHT pneumothorax post thoracostomy tube placement. Electronically Signed   By: Lavonia Dana M.D.   On: 06/03/2019 09:08   DG Chest Port 1 View  Result Date: 06/02/2019 CLINICAL DATA:  49 year old female with injury, fall from horse EXAM: PORTABLE CHEST 1 VIEW COMPARISON:  None. FINDINGS: Cardiomediastinal silhouette within normal limits in size and contour. Left lung without confluent airspace disease pneumothorax or pleural effusion. Right-sided pneumothorax. Nondisplaced rib fracture of the posterior fifth rib with questionable nondisplaced rib fractures of the sixth and seventh ribs. Using Collins method of % PTX = 4.2 + 4.7 * (A+B+C) where A =3.4cm, B =2.1cm, C = 1.2cm percentage is estimated 36%. IMPRESSION: Right-sided pneumothorax, estimated 36% using Collins  method. Critical Value/emergent results were discussed in person at the time of interpretation on 06/02/2019 at 2:05 pm with Dr. Rolm Bookbinder at the time of interpretation. Nondisplaced right fifth rib fracture  with questionable fractures of ribs 6 and 7. Electronically Signed   By: Corrie Mckusick D.O.   On: 06/02/2019 14:05    Anti-infectives: Anti-infectives (From admission, onward)   None       Assessment/Plan Fall off horse Right PTX/right rib fractures 5-9-s/p Chest Tube. CXR this AM with slight enlargement in right ptx, put CT back to -20 suction, IS and flutter valve, repeat CXR in AM AKI - mild, Cr 1.08, give 500 cc bolus, repeat BMP in AM Neck pain - CT neg, no SP tenderness and able to actively range neck. Likely a strain, continue heat PRN Anxiety - home meds reordered  ID - none FEN - KVO IVF, reg diet, bowel regimen VTE - SCDs, lovenox Foley - none Follow up - trauma  Dispo: put CT back to suction, repeat film in AM. Pain control - increased scheduled robaxin, d/c norco, added scheduled tylenol and oxycodone.   LOS: 2 days    Norm Parcel , [facility] 06/04/2019, 9:05 AM Please see Amion for pager number during day hours 7:00am-4:30pm

## 2019-06-05 ENCOUNTER — Encounter (HOSPITAL_COMMUNITY): Payer: Self-pay

## 2019-06-05 ENCOUNTER — Other Ambulatory Visit: Payer: Self-pay

## 2019-06-05 ENCOUNTER — Inpatient Hospital Stay (HOSPITAL_COMMUNITY): Payer: BC Managed Care – PPO

## 2019-06-05 LAB — BASIC METABOLIC PANEL
Anion gap: 6 (ref 5–15)
BUN: 8 mg/dL (ref 6–20)
CO2: 26 mmol/L (ref 22–32)
Calcium: 8.5 mg/dL — ABNORMAL LOW (ref 8.9–10.3)
Chloride: 104 mmol/L (ref 98–111)
Creatinine, Ser: 0.9 mg/dL (ref 0.44–1.00)
GFR calc Af Amer: >60 mL/min (ref >60)
GFR calc non Af Amer: >60 mL/min (ref >60)
Glucose, Bld: 105 mg/dL — ABNORMAL HIGH (ref 70–99)
Potassium: 3.7 mmol/L (ref 3.5–5.1)
Sodium: 136 mmol/L (ref 135–145)

## 2019-06-05 LAB — CBC
HCT: 37.6 % (ref 36.0–46.0)
Hemoglobin: 12.5 g/dL (ref 12.0–15.0)
MCH: 31.2 pg (ref 26.0–34.0)
MCHC: 33.2 g/dL (ref 30.0–36.0)
MCV: 93.8 fL (ref 80.0–100.0)
Platelets: 276 10*3/uL (ref 150–400)
RBC: 4.01 MIL/uL (ref 3.87–5.11)
RDW: 12.6 % (ref 11.5–15.5)
WBC: 6.3 10*3/uL (ref 4.0–10.5)
nRBC: 0 % (ref 0.0–0.2)

## 2019-06-05 MED ORDER — TRAMADOL HCL 50 MG PO TABS
50.0000 mg | ORAL_TABLET | Freq: Four times a day (QID) | ORAL | Status: DC
  Administered 2019-06-05 – 2019-06-07 (×9): 50 mg via ORAL
  Filled 2019-06-05 (×9): qty 1

## 2019-06-05 NOTE — Progress Notes (Signed)
[facility] Progress Note     Subjective: Patient reports that pain in R chest and ribs is waking her up from sleep, feels sharp. It feels to her like her lung has collapsed. We discussed CXR from this AM and that lung actually appears improved from yesterday. Felt better ambulating some yesterday. Husband at bedside.   Review of Systems  Constitutional: Negative for chills and fever.  Respiratory: Positive for shortness of breath.   Cardiovascular: Positive for chest pain (R ribs).  Gastrointestinal: Negative for abdominal pain, nausea and vomiting.  Genitourinary: Negative for dysuria, frequency and urgency.     Objective: Vital signs in last 24 hours: Temp:  [98.1 F (36.7 C)-98.8 F (37.1 C)] 98.2 F (36.8 C) (05/04 0516) Pulse Rate:  [74-82] 82 (05/04 0516) Resp:  [17-18] 17 (05/04 0516) BP: (112-146)/(76-92) 146/92 (05/04 0516) SpO2:  [96 %-98 %] 96 % (05/04 0516) Last BM Date: 06/03/19  Intake/Output from previous day: 05/03 0701 - 05/04 0700 In: 1080 [P.O.:1080] Out: 130 [Chest Tube:130] Intake/Output this shift: No intake/output data recorded.  PE: General: pleasant, WD, WN female who is laying in bed in NAD HEENT: head is normocephalic, atraumatic.  Sclera are noninjected.  PERRL.  Ears and nose without any masses or lesions.  Mouth is pink and moist Heart: regular, rate, and rhythm.  Normal s1,s2. No obvious murmurs, gallops, or rubs noted.  Palpable radial and pedal pulses bilaterally Lungs: lungs CTAB.  Respiratory effort somewhat labored secondary to pain. R CT in place with serous drainage in tubing Abd: soft, NT, ND, +BS, no masses, hernias, or organomegaly MS: all 4 extremities are symmetrical with no cyanosis, clubbing, or edema. Skin: warm and dry with no masses, lesions, or rashes Neuro: Cranial nerves 2-12 grossly intact, sensation grossly intact throughout Psych: A&Ox3 with an appropriate affect.   Lab Results:  Recent Labs     06/04/19 0153 06/05/19 0320  WBC 9.1 6.3  HGB 11.8* 12.5  HCT 36.6 37.6  PLT 244 276   BMET Recent Labs    06/04/19 0153 06/05/19 0320  NA 138 136  K 4.1 3.7  CL 107 104  CO2 26 26  GLUCOSE 100* 105*  BUN 11 8  CREATININE 1.08* 0.90  CALCIUM 8.3* 8.5*   PT/INR Recent Labs    06/02/19 1242  LABPROT 12.8  INR 1.0   CMP     Component Value Date/Time   NA 136 06/05/2019 0320   K 3.7 06/05/2019 0320   CL 104 06/05/2019 0320   CO2 26 06/05/2019 0320   GLUCOSE 105 (H) 06/05/2019 0320   BUN 8 06/05/2019 0320   CREATININE 0.90 06/05/2019 0320   CALCIUM 8.5 (L) 06/05/2019 0320   PROT 6.9 06/02/2019 1242   ALBUMIN 3.5 06/02/2019 1242   AST 36 06/02/2019 1242   ALT 29 06/02/2019 1242   ALKPHOS 65 06/02/2019 1242   BILITOT 0.7 06/02/2019 1242   GFRNONAA >60 06/05/2019 0320   GFRAA >60 06/05/2019 0320   Lipase  No results found for: LIPASE     Studies/Results: DG CHEST PORT 1 VIEW  Result Date: 06/04/2019 CLINICAL DATA:  Right pneumothorax. EXAM: PORTABLE CHEST 1 VIEW COMPARISON:  Same day. FINDINGS: The heart size and mediastinal contours are within normal limits. Stable position of right-sided chest tube. Minimal right apical pneumothorax is noted which is decreased compared to prior exam. Left lung is clear. The visualized skeletal structures are unremarkable. IMPRESSION: Stable position of right-sided chest tube. Minimal right apical  pneumothorax is noted which is decreased compared to prior exam. No other abnormality seen in the chest. Electronically Signed   By: Marijo Conception M.D.   On: 06/04/2019 16:23   DG Chest Port 1 View  Result Date: 06/04/2019 CLINICAL DATA:  Respiratory failure. Right-sided chest tube. EXAM: PORTABLE CHEST 1 VIEW COMPARISON:  06/04/2019 at 0550 hours. FINDINGS: The right-sided chest tube is stable. Small residual apical right-sided pneumothorax appears stable. Streaky right basilar atelectasis. The left lung remains relatively clear. The  cardiac silhouette, mediastinal and hilar contours are within normal limits and unchanged. The right-sided rib fractures are not well demonstrated. IMPRESSION: 1. Stable right-sided chest tube with persistent small residual apical pneumothorax. 2. Streaky right basilar atelectasis. Electronically Signed   By: Marijo Sanes M.D.   On: 06/04/2019 13:23   DG CHEST PORT 1 VIEW  Result Date: 06/04/2019 CLINICAL DATA:  Chest tube. EXAM: PORTABLE CHEST 1 VIEW COMPARISON:  Jun 03, 2019. FINDINGS: The heart size and mediastinal contours are within normal limits. Right-sided chest tube is unchanged in position. Left lung is clear. Small right apical pneumothorax is noted which is enlarged compared to prior exam. The visualized skeletal structures are unremarkable. IMPRESSION: Small right apical pneumothorax is noted which is enlarged compared to prior exam. Right-sided chest tube is unchanged in position. Electronically Signed   By: Marijo Conception M.D.   On: 06/04/2019 08:17    Anti-infectives: Anti-infectives (From admission, onward)   None       Assessment/Plan Fall off horse RightPTX/right rib fractures 5-9-s/p Chest Tube. CXR yesterday with worsened ptx, CT put back up to -40 suction, CXR this AM with improvement in ptx - continue IS and pain control, flutter valve AKI - Cr 0.9, improving Neck pain - CT neg, no SP tenderness and able to actively range neck. Likely a strain, continue heat PRN Anxiety - home meds reordered  ID -none FEN -KVO IVF, reg diet, bowel regimen VTE - SCDs, lovenox Foley -none Follow up -trauma  Dispo: Continue CT on -40 suction today, repeat CXR tomorrow AM and possibly WS. Continue therapies, pain control.   LOS: 3 days    Norm Parcel , [facility] 06/05/2019, 8:23 AM Please see Amion for pager number during day hours 7:00am-4:30pm

## 2019-06-06 ENCOUNTER — Inpatient Hospital Stay (HOSPITAL_COMMUNITY): Payer: BC Managed Care – PPO

## 2019-06-06 NOTE — Progress Notes (Signed)
Physical Therapy Treatment Patient Details Name: Linda West MRN: [id number] DOB: 09/23/1970 Today's Date: 06/06/2019    History of Present Illness Pt is a 49 y.o. female who presented to the ED 06/02/2019 following a fall from a horse. She sustained R pneumothorax and R rib fxs 5-9.    PT Comments    Pt found walking with husband in hallway. Pt reports she has been walking with husband every day between 1 and 3 laps around the unit. Pt demonstrates improvement with ability to walk >262ft without RW min guard.  Completed stair negotiation of 1 step to mimic home environment. Pt was educated on bracing with a pillow when coughing, which she reported did not help alleviate her pain. Pt reported she has the most difficulty with sit to stand/stand to sit and bed mobility. Will plan to address this next session. Pt remains appropriate for physical therapy while in the acute care setting to address decreased functional mobility and pain.   Follow Up Recommendations  No PT follow up;Supervision/Assistance - 24 hour     Equipment Recommendations  None recommended by PT    Recommendations for Other Services       Precautions / Restrictions Precautions Precautions: Other (comment);Fall Precaution Comments: chest tube Restrictions Weight Bearing Restrictions: No    Mobility  Bed Mobility                  Transfers Overall transfer level: Needs assistance Equipment used: None Transfers: Sit to/from Stand Sit to Stand: Min guard         General transfer comment: increased time, min guard for safety  Ambulation/Gait Ambulation/Gait assistance: Min guard Gait Distance (Feet): 220 Feet Assistive device: None Gait Pattern/deviations: Step-through pattern;Decreased stride length Gait velocity: Decreased   General Gait Details: pt required increased time to amb, R hand on R chest. Min guard for saftey.   Stairs Stairs: Yes Stairs assistance: Min guard Stair Management:  One rail Left Number of Stairs: 1 General stair comments: Up facing forward, down stepping backward.   Wheelchair Mobility    Modified Rankin (Stroke Patients Only)       Balance Overall balance assessment: Needs assistance         Standing balance support: During functional activity;No upper extremity supported Standing balance-Leahy Scale: Fair                              Cognition Arousal/Alertness: Awake/alert Behavior During Therapy: WFL for tasks assessed/performed Overall Cognitive Status: Within Functional Limits for tasks assessed                                        Exercises      General Comments General comments (skin integrity, edema, etc.): pt found amb with husband in hallway. Reports she ambulated 3 laps around unit 2 days ago, 1 lap around unit yesterday. 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      Pertinent Vitals/Pain Pain Assessment: 0-10 Pain Score: 2  Pain Location: R flank Pain Descriptors / Indicators: Discomfort;Grimacing;Guarding Pain Intervention(s): Limited activity within patient's tolerance;Premedicated before session;Repositioned;Monitored during session    Home Living                      Prior Function            PT Goals (current  goals can now be found in the care plan section) Acute Rehab PT Goals Patient Stated Goal: home PT Goal Formulation: With patient/family Time For Goal Achievement: 06/18/19 Potential to Achieve Goals: Good Progress towards PT goals: Progressing toward goals    Frequency    Min 3X/week      PT Plan Current plan remains appropriate    Co-evaluation              AM-PAC PT "6 Clicks" Mobility   Outcome Measure  Help needed turning from your back to your side while in a flat bed without using bedrails?: A Little Help needed moving from lying on your back to sitting on the side of a flat bed without using bedrails?: A Little Help needed  moving to and from a bed to a chair (including a wheelchair)?: A Little Help needed standing up from a chair using your arms (e.g., wheelchair or bedside chair)?: A Little Help needed to walk in hospital room?: A Little Help needed climbing 3-5 steps with a railing? : A Little 6 Click Score: 18    End of Session Equipment Utilized During Treatment: Gait belt Activity Tolerance: Patient tolerated treatment well Patient left: in chair;with call bell/phone within reach;with family/visitor present Nurse Communication: Mobility status PT Visit Diagnosis: Pain;Difficulty in walking, not elsewhere classified (R26.2) Pain - Right/Left: Right Pain - part of body: Hip(R chest)     Time: [id number] PT Time Calculation (min) (ACUTE ONLY): 16 min  Charges:  $Gait Training: 8-22 mins                     [employer] SPT 06/06/2019    Rolland Porter 06/06/2019, 10:14 AM

## 2019-06-06 NOTE — Progress Notes (Signed)
[facility] Progress Note     Subjective: Patient reports she is more comfortable this AM than yesterday, however still has significant discomfort with mobilization. Denies SOB this AM. Back to pulling IS up to 750 yesterday. Tolerating diet and +flatus.   Objective: Vital signs in last 24 hours: Temp:  [97.6 F (36.4 C)-98.3 F (36.8 C)] 97.6 F (36.4 C) (05/05 0448) Pulse Rate:  [70-83] 72 (05/05 0448) Resp:  [20] 20 (05/05 0448) BP: (124-134)/(86-90) 124/86 (05/05 0448) SpO2:  [96 %-97 %] 97 % (05/05 0448) Last BM Date: 06/03/19  Intake/Output from previous day: 05/04 0701 - 05/05 0700 In: 750 [P.O.:750] Out: 80 [Chest Tube:80] Intake/Output this shift: No intake/output data recorded.  PE: General: pleasant, WD, WNfemale who is laying in bed in NAD HEENT: head is normocephalic, atraumatic. Sclera are noninjected. PERRL. Ears and nose without any masses or lesions. Mouth is pink and moist Heart: regular, rate, and rhythm. Normal s1,s2. No obvious murmurs, gallops, or rubs noted. Palpable radial and pedal pulses bilaterally Lungs:lungs CTAB. Respiratory effort somewhat labored secondary to pain. R CT in place with serous drainage  Abd: soft, NT, ND, +BS, no masses, hernias, or organomegaly MS: all 4 extremities are symmetrical with no cyanosis, clubbing, or edema. Skin: warm and dry with no masses, lesions, or rashes Neuro: Cranial nerves 2-12 grossly intact, sensation grossly intact throughout Psych: A&Ox3 with an appropriate affect.   Lab Results:  Recent Labs    06/04/19 0153 06/05/19 0320  WBC 9.1 6.3  HGB 11.8* 12.5  HCT 36.6 37.6  PLT 244 276   BMET Recent Labs    06/04/19 0153 06/05/19 0320  NA 138 136  K 4.1 3.7  CL 107 104  CO2 26 26  GLUCOSE 100* 105*  BUN 11 8  CREATININE 1.08* 0.90  CALCIUM 8.3* 8.5*   PT/INR No results for input(s): LABPROT, INR in the last 72 hours. CMP     Component Value Date/Time   NA 136  06/05/2019 0320   K 3.7 06/05/2019 0320   CL 104 06/05/2019 0320   CO2 26 06/05/2019 0320   GLUCOSE 105 (H) 06/05/2019 0320   BUN 8 06/05/2019 0320   CREATININE 0.90 06/05/2019 0320   CALCIUM 8.5 (L) 06/05/2019 0320   PROT 6.9 06/02/2019 1242   ALBUMIN 3.5 06/02/2019 1242   AST 36 06/02/2019 1242   ALT 29 06/02/2019 1242   ALKPHOS 65 06/02/2019 1242   BILITOT 0.7 06/02/2019 1242   GFRNONAA >60 06/05/2019 0320   GFRAA >60 06/05/2019 0320   Lipase  No results found for: LIPASE     Studies/Results: DG CHEST PORT 1 VIEW  Result Date: 06/05/2019 CLINICAL DATA:  Right-sided pneumothorax. Follow-up. EXAM: PORTABLE CHEST 1 VIEW COMPARISON:  1 day prior FINDINGS: Midline trachea. Normal heart size. No pleural fluid. Right pigtail pleural catheter remains in place. Resolution of right apical pneumothorax, with only minimal pleural thickening remaining. Right hemidiaphragm elevation. Improvement in right infrahilar volume loss. IMPRESSION: Resolution of right apical pneumothorax, with pleural catheter remaining in place. Electronically Signed   By: Abigail Miyamoto M.D.   On: 06/05/2019 08:47   DG CHEST PORT 1 VIEW  Result Date: 06/04/2019 CLINICAL DATA:  Right pneumothorax. EXAM: PORTABLE CHEST 1 VIEW COMPARISON:  Same day. FINDINGS: The heart size and mediastinal contours are within normal limits. Stable position of right-sided chest tube. Minimal right apical pneumothorax is noted which is decreased compared to prior exam. Left lung is clear. The visualized skeletal  structures are unremarkable. IMPRESSION: Stable position of right-sided chest tube. Minimal right apical pneumothorax is noted which is decreased compared to prior exam. No other abnormality seen in the chest. Electronically Signed   By: Marijo Conception M.D.   On: 06/04/2019 16:23   DG Chest Port 1 View  Result Date: 06/04/2019 CLINICAL DATA:  Respiratory failure. Right-sided chest tube. EXAM: PORTABLE CHEST 1 VIEW COMPARISON:   06/04/2019 at 0550 hours. FINDINGS: The right-sided chest tube is stable. Small residual apical right-sided pneumothorax appears stable. Streaky right basilar atelectasis. The left lung remains relatively clear. The cardiac silhouette, mediastinal and hilar contours are within normal limits and unchanged. The right-sided rib fractures are not well demonstrated. IMPRESSION: 1. Stable right-sided chest tube with persistent small residual apical pneumothorax. 2. Streaky right basilar atelectasis. Electronically Signed   By: Marijo Sanes M.D.   On: 06/04/2019 13:23    Anti-infectives: Anti-infectives (From admission, onward)   None       Assessment/Plan Fall off horse RightPTX/right rib fractures 5-9-s/p Chest Tube.CXR shows lung remains up on -40 of suction, WS chest tube today and repeat CXR this afternoon, if lung stays up on WS for the next 24 hrs, likely remove CT tomorrow - continue IS and pain control, flutter valve AKI - Cr 0.9 5/4, UOP good Neck pain - CT neg, no SP tenderness and able to actively range neck. Likely a strain, continue heat PRN Anxiety - home meds reordered  ID -none FEN -KVO IVF, reg diet, bowel regimen VTE - SCDs, lovenox Foley -none Follow up -trauma  Dispo: CT to WS today, repeat CXR this afternoon. Pain control, IS.   LOS: 4 days    Norm Parcel , [facility] 06/06/2019, 7:47 AM Please see Amion for pager number during day hours 7:00am-4:30pm

## 2019-06-07 ENCOUNTER — Inpatient Hospital Stay (HOSPITAL_COMMUNITY): Payer: BC Managed Care – PPO

## 2019-06-07 MED ORDER — TRAMADOL HCL 50 MG PO TABS: 50.0000 mg | ORAL_TABLET | Freq: Four times a day (QID) | ORAL | 0 refills | Status: AC | PRN

## 2019-06-07 MED ORDER — ACETAMINOPHEN 500 MG PO TABS: 1000.0000 mg | ORAL_TABLET | Freq: Four times a day (QID) | ORAL | Status: AC | PRN

## 2019-06-07 MED ORDER — OXYCODONE HCL 5 MG PO TABS: 5.0000 mg | ORAL_TABLET | Freq: Four times a day (QID) | ORAL | 0 refills | Status: AC | PRN

## 2019-06-07 MED ORDER — METHOCARBAMOL 500 MG PO TABS: 1000.0000 mg | ORAL_TABLET | Freq: Four times a day (QID) | ORAL | 1 refills | Status: AC | PRN

## 2019-06-07 NOTE — Discharge Instructions (Signed)
Pneumothorax °A pneumothorax is commonly called a collapsed lung. It is a condition in which air leaks from a lung and builds up between the thin layer of tissue that covers the lungs (visceral pleura) and the interior wall of the chest cavity (parietal pleura). The air gets trapped outside the lung, between the lung and the chest wall (pleural space). The air takes up space and prevents the lung from fully expanding. °This condition sometimes occurs suddenly with no apparent cause. The buildup of air may be small or large. A small pneumothorax may go away on its own. A large pneumothorax will require treatment and hospitalization. °What are the causes? °This condition may be caused by: °· Trauma and injury to the chest wall. °· Surgery and other medical procedures. °· A complication of an underlying lung problem, especially chronic obstructive pulmonary disease (COPD) or emphysema. °Sometimes the cause of this condition is not known. °What increases the risk? °You are more likely to develop this condition if: °· You have an underlying lung problem. °· You smoke. °· You are 20-40 years old, female, tall, and underweight. °· You have a personal or family history of pneumothorax. °· You have an eating disorder (anorexia nervosa). °This condition can also happen quickly, even in people with no history of lung problems. °What are the signs or symptoms? °Sometimes a pneumothorax will have no symptoms. When symptoms are present, they can include: °· Chest pain. °· Shortness of breath. °· Increased rate of breathing. °· Bluish color to your lips or skin (cyanosis). °How is this diagnosed? °This condition may be diagnosed by: °· A medical history and physical exam. °· A chest X-ray, chest CT scan, or ultrasound. °How is this treated? °Treatment depends on how severe your condition is. The goal of treatment is to remove the extra air and allow your lung to expand back to its normal size. °· For a small pneumothorax: °? No  treatment may be needed. °? Extra oxygen is sometimes used to make it go away more quickly. °· For a large pneumothorax or a pneumothorax that is causing symptoms, a procedure is done to drain the air from your lungs. To do this, a health care provider may use: °? A needle with a syringe. This is used to suck air from a pleural space where no additional leakage is taking place. °? A chest tube. This is used to suck air where there is ongoing leakage into the pleural space. The chest tube may need to remain in place for several days until the air leak has healed. °· In more severe cases, surgery may be needed to repair the damage that is causing the leak. °· If you have multiple pneumothorax episodes or have an air leak that will not heal, a procedure called a pleurodesis may be done. A medicine is placed in the pleural space to irritate the tissues around the lung so that the lung will stick to the chest wall, seal any leaks, and stop any buildup of air in that space. °If you have an underlying lung problem, severe symptoms, or a large pneumothorax you will usually need to stay in the hospital. °Follow these instructions at home: °Lifestyle °· Do not use any products that contain nicotine or tobacco, such as cigarettes and e-cigarettes. These are major risk factors in pneumothorax. If you need help quitting, ask your health care provider. °· Do not lift anything that is heavier than 10 lb (4.5 kg), or the limit that your health care   provider tells you, until he or she says that it is safe.  Avoid activities that take a lot of effort (strenuous) for as long as told by your health care provider.  Return to your normal activities as told by your health care provider. Ask your health care provider what activities are safe for you.  Do not fly in an airplane or scuba dive until your health care provider says it is okay. General instructions  Take over-the-counter and prescription medicines only as told by your  health care provider.  If a cough or pain makes it difficult for you to sleep at night, try sleeping in a semi-upright position in a recliner or by using 2 or 3 pillows.  If you had a chest tube and it was removed, ask your health care provider when you can remove the bandage (dressing). While the dressing is in place, do not allow it to get wet.  Keep all follow-up visits as told by your health care provider. This is important. Contact a health care provider if:  You cough up thick mucus (sputum) that is yellow or green in color.  You were treated with a chest tube, and you have redness, increasing pain, or discharge at the site where it was placed. Get help right away if:  You have increasing chest pain or shortness of breath.  You have a cough that will not go away.  You begin coughing up blood.  You have pain that is getting worse or is not controlled with medicines.  The site where your chest tube was located opens up.  You feel air coming out of the site where the chest tube was placed.  You have a fever or persistent symptoms for more than 2-3 days.  You have a fever and your symptoms suddenly get worse. These symptoms may represent a serious problem that is an emergency. Do not wait to see if the symptoms will go away. Get medical help right away. Call your local emergency services (911 in the [place] Do not drive yourself to the hospital. Summary  A pneumothorax, commonly called a collapsed lung, is a condition in which air leaks from a lung and gets trapped between the lung and the chest wall (pleural space).  The buildup of air may be small or large. A small pneumothorax may go away on its own. A large pneumothorax will require treatment and hospitalization.  Treatment for this condition depends on how severe the pneumothorax is. The goal of treatment is to remove the extra air and allow the lung to expand back to its normal size. This information is not intended to  replace advice given to you by your health care provider. Make sure you discuss any questions you have with your health care provider. Document Revised: 12/31/2016 Document Reviewed: 12/27/2016 Elsevier Patient Education  2020 [employer]  Rib Fracture  A rib fracture is a break or crack in one of the bones of the ribs. The ribs are like a cage that goes around your upper chest. A broken or cracked rib is often painful, but most do not cause other problems. Most rib fractures usually heal on their own in 1-3 months. Follow these instructions at home: Managing pain, stiffness, and swelling  If directed, apply ice to the injured area. ? Put ice in a plastic bag. ? Place a towel between your skin and the bag. ? Leave the ice on for 20 minutes, 2-3 times a day.  Take over-the-counter and prescription   medicines only as told by your doctor. Activity  Avoid activities that cause pain to the injured area. Protect your injured area.  Slowly increase activity as told by your doctor. General instructions  Do deep breathing as told by your doctor. You may be told to: ? Take deep breaths many times a day. ? Cough many times a day while hugging a pillow. ? Use a device (incentive spirometer) to do deep breathing many times a day.  Drink enough fluid to keep your pee (urine) clear or pale yellow.  Do not wear a rib belt or binder. These do not allow you to breathe deeply.  Keep all follow-up visits as told by your doctor. This is important. Contact a doctor if:  You have a fever. Get help right away if:  You have trouble breathing.  You are short of breath.  You cannot stop coughing.  You cough up thick or bloody spit (sputum).  You feel sick to your stomach (nauseous), throw up (vomit), or have belly (abdominal) pain.  Your pain gets worse and medicine does not help. Summary  A rib fracture is a break or crack in one of the bones of the ribs.  Apply ice to the injured area  and take medicines for pain as told by your doctor.  Take deep breaths and cough many times a day. Hug a pillow every time you cough. This information is not intended to replace advice given to you by your health care provider. Make sure you discuss any questions you have with your health care provider. Document Revised: 12/31/2016 Document Reviewed: 04/20/2016 Elsevier Patient Education  2020 [employer] Inc.  

## 2019-06-07 NOTE — Progress Notes (Signed)
[facility] Progress Note     Subjective: Patient sitting up in chair, having pain in R chest this AM but overall pain controlled with PO meds. Mobilizing well. Nervous about chest tube removal but hopeful to be able to go home today.   Objective: Vital signs in last 24 hours: Temp:  [98.2 F (36.8 C)-98.6 F (37 C)] 98.2 F (36.8 C) (05/06 0547) Pulse Rate:  [72-96] 96 (05/06 0547) Resp:  [16-17] 17 (05/06 0547) BP: (112-135)/(71-86) 135/82 (05/06 0547) SpO2:  [97 %-98 %] 97 % (05/06 0547) Last BM Date: 06/03/19  Intake/Output from previous day: 05/05 0701 - 05/06 0700 In: 887 [P.O.:887] Out: 40 [Chest Tube:40] Intake/Output this shift: Total I/O In: 320 [P.O.:320] Out: -   PE: General: pleasant, WD, WNfemale who is laying in bed in NAD HEENT: head is normocephalic, atraumatic. Sclera are noninjected. PERRL. Ears and nose without any masses or lesions. Mouth is pink and moist Heart: regular, rate, and rhythm. Normal s1,s2. No obvious murmurs, gallops, or rubs noted. Palpable radial and pedal pulses bilaterally Lungs:lungs CTAB. Respiratory effortnon-labored. R CT in place with serous drainage  Abd: soft, NT, ND, +BS, no masses, hernias, or organomegaly MS: all 4 extremities are symmetrical with no cyanosis, clubbing, or edema. Skin: warm and dry with no masses, lesions, or rashes Neuro: Cranial nerves 2-12 grossly intact, sensation grossly intact throughout Psych: A&Ox3 with an appropriate affect.   Lab Results:  Recent Labs    06/05/19 0320  WBC 6.3  HGB 12.5  HCT 37.6  PLT 276   BMET Recent Labs    06/05/19 0320  NA 136  K 3.7  CL 104  CO2 26  GLUCOSE 105*  BUN 8  CREATININE 0.90  CALCIUM 8.5*   PT/INR No results for input(s): LABPROT, INR in the last 72 hours. CMP     Component Value Date/Time   NA 136 06/05/2019 0320   K 3.7 06/05/2019 0320   CL 104 06/05/2019 0320   CO2 26 06/05/2019 0320   GLUCOSE 105 (H) 06/05/2019  0320   BUN 8 06/05/2019 0320   CREATININE 0.90 06/05/2019 0320   CALCIUM 8.5 (L) 06/05/2019 0320   PROT 6.9 06/02/2019 1242   ALBUMIN 3.5 06/02/2019 1242   AST 36 06/02/2019 1242   ALT 29 06/02/2019 1242   ALKPHOS 65 06/02/2019 1242   BILITOT 0.7 06/02/2019 1242   GFRNONAA >60 06/05/2019 0320   GFRAA >60 06/05/2019 0320   Lipase  No results found for: LIPASE     Studies/Results: DG CHEST PORT 1 VIEW  Result Date: 06/06/2019 CLINICAL DATA:  Right-sided pneumothorax. EXAM: PORTABLE CHEST 1 VIEW COMPARISON:  06/06/2019 earlier FINDINGS: No change right basilar chest tube. Lungs are hypoinflated. No right-sided pneumothorax visualized. Stable subtle bibasilar density likely atelectasis. Cardiomediastinal silhouette and remainder the exam is unchanged. IMPRESSION: Hypoinflation with suggestion of subtle bibasilar atelectasis. Right basilar chest tube unchanged.  No pneumothorax. Electronically Signed   By: Marin Olp M.D.   On: 06/06/2019 13:11   DG CHEST PORT 1 VIEW  Result Date: 06/06/2019 CLINICAL DATA:  49 year old female with history of right-sided pneumothorax status post chest tube placement. EXAM: PORTABLE CHEST 1 VIEW COMPARISON:  Chest x-ray 06/05/2019. FINDINGS: Small bore right-sided chest tube in place with pigtail reformed over the medial aspect of the lower right hemithorax. No definite pneumothorax. Lung volumes are low. Bibasilar opacities favored to reflect areas of mild subsegmental atelectasis. No acute consolidative airspace disease. No pleural effusions. No evidence  of pulmonary edema. Heart size is normal. Upper mediastinal contours are within normal limits. IMPRESSION: 1. Right-sided chest tube is stable in position. No appreciable pneumothorax. 2. Low lung volumes with bibasilar areas of subsegmental atelectasis. Electronically Signed   By: Vinnie Langton M.D.   On: 06/06/2019 08:44    Anti-infectives: Anti-infectives (From admission, onward)   None        Assessment/Plan Fall off horse RightPTX/right rib fractures 5-9-s/p Chest Tube.CXRstable after 24 hrs on WS, removed right CT, repeat CXR in 3 hrs - continue IS and pain control, flutter valve AKI -Cr 0.9 5/4, UOP good Neck pain - CT neg, no SP tenderness and able to actively range neck. Likely a strain, continue heat PRN Anxiety - home meds reordered  ID -none FEN -KVO IVF, reg diet, bowel regimen VTE - SCDs, lovenox Foley -none Follow up -trauma  Dispo:Repeat CXR later this AM, if stable then d/c home  LOS: 5 days    Norm Parcel , [facility] 06/07/2019, 9:06 AM Please see Amion for pager number during day hours 7:00am-4:30pm

## 2019-06-07 NOTE — Progress Notes (Signed)
Physical Therapy Treatment Patient Details Name: Linda West MRN: [id number] DOB: Jul 02, 1970 Today's Date: 06/07/2019    History of Present Illness Pt is a 49 y.o. female who presented to the ED 06/02/2019 following a fall from a horse. She sustained R pneumothorax and R rib fxs 5-9.   PT Comments    Pt progressing with mobility, remains limited by pain. Husband present for education, husband able to demonstrate correct technique for guarding and providing assist when needed for pt's mobility. Educ on importance of continued mobility during admission and upon return home. Pt and husband have no further questions or concerns. Will d/c acute PT.    Follow Up Recommendations  No PT follow up;Supervision/Assistance - 24 hour     Equipment Recommendations  None recommended by PT    Recommendations for Other Services       Precautions / Restrictions Precautions Precautions: Fall;Other (comment) Precaution Comments: R-side chest tube (high insertion) Restrictions Weight Bearing Restrictions: No    Mobility  Bed Mobility Overal bed mobility: Needs Assistance Bed Mobility: Supine to Sit     Supine to sit: Min assist;HOB elevated     General bed mobility comments: Pt declined bed mobility practice with bed flat; husband able to demonstrate providing minA for trunk elevation to minimize pt's pain; pt plans to sleep in recliner, husband available to physically assist  Transfers Overall transfer level: Needs assistance Equipment used: Rolling walker (2 wheeled) Transfers: Sit to/from Stand Sit to Stand: Min guard         General transfer comment: Pt requesting use of RW since first time up this morning; husband providing standby assist, pt able to stand from EOB and BSC (over toilet) to RW with good technique  Ambulation/Gait Ambulation/Gait assistance: Supervision Gait Distance (Feet): 32 Feet Assistive device: Rolling walker (2 wheeled) Gait Pattern/deviations:  Step-through pattern;Decreased stride length;Antalgic Gait velocity: Decreased   General Gait Details: Increased time and effort due to pain; supervision for safety; pt declined attempt without RW; declined further mobility secondary to pain and feeling sick   Stairs             Wheelchair Mobility    Modified Rankin (Stroke Patients Only)       Balance Overall balance assessment: Needs assistance   Sitting balance-Leahy Scale: Good       Standing balance-Leahy Scale: Fair                              Cognition Arousal/Alertness: Awake/alert Behavior During Therapy: WFL for tasks assessed/performed Overall Cognitive Status: Within Functional Limits for tasks assessed                                        Exercises      General Comments General comments (skin integrity, edema, etc.): Husband present for education and able to demonstrate correct technqiue for guarding and assist; they have no further questions; hopeful for chest tube removal today      Pertinent Vitals/Pain Pain Assessment: Faces Faces Pain Scale: Hurts even more Pain Location: R flank Pain Descriptors / Indicators: Discomfort;Grimacing;Guarding;Moaning Pain Intervention(s): Monitored during session;Limited activity within patient's tolerance    Home Living                      Prior Function  PT Goals (current goals can now be found in the care plan section) Progress towards PT goals: Goals met/education completed, patient discharged from PT    Frequency    Min 3X/week      PT Plan Current plan remains appropriate    Co-evaluation              AM-PAC PT "6 Clicks" Mobility   Outcome Measure  Help needed turning from your back to your side while in a flat bed without using bedrails?: A Little Help needed moving from lying on your back to sitting on the side of a flat bed without using bedrails?: A Little Help needed  moving to and from a bed to a chair (including a wheelchair)?: A Little Help needed standing up from a chair using your arms (e.g., wheelchair or bedside chair)?: A Little Help needed to walk in hospital room?: None Help needed climbing 3-5 steps with a railing? : A Little 6 Click Score: 19    End of Session   Activity Tolerance: Patient limited by pain Patient left: in chair;with call bell/phone within reach;with family/visitor present Nurse Communication: Mobility status PT Visit Diagnosis: Pain;Difficulty in walking, not elsewhere classified (R26.2)     Time: 1937-9024 PT Time Calculation (min) (ACUTE ONLY): 12 min  Charges:  $Self Care/Home Management: 8-22                    Mabeline Caras, PT, DPT Acute Rehabilitation Services  Pager [phone] Office [place] 06/07/2019, 8:29 AM

## 2019-06-07 NOTE — Discharge Summary (Signed)
[facility] Discharge Summary   Patient ID: Linda West MRN: [id number] DOB/AGE: [birth year] 49 y.o.  Admit date: 06/02/2019 Discharge date: 06/07/2019  Discharge Diagnosis Patient Active Problem List   Diagnosis Date Noted  . Fall from horse 06/02/2019  right Rib Fractures 5-9 Right pneumothorax  Consultants None   Imaging: DG CHEST PORT 1 VIEW  Result Date: 06/06/2019 CLINICAL DATA:  Right-sided pneumothorax. EXAM: PORTABLE CHEST 1 VIEW COMPARISON:  06/06/2019 earlier FINDINGS: No change right basilar chest tube. Lungs are hypoinflated. No right-sided pneumothorax visualized. Stable subtle bibasilar density likely atelectasis. Cardiomediastinal silhouette and remainder the exam is unchanged. IMPRESSION: Hypoinflation with suggestion of subtle bibasilar atelectasis. Right basilar chest tube unchanged.  No pneumothorax. Electronically Signed   By: Marin Olp M.D.   On: 06/06/2019 13:11   DG CHEST PORT 1 VIEW  Result Date: 06/06/2019 CLINICAL DATA:  49 year old female with history of right-sided pneumothorax status post chest tube placement. EXAM: PORTABLE CHEST 1 VIEW COMPARISON:  Chest x-ray 06/05/2019. FINDINGS: Small bore right-sided chest tube in place with pigtail reformed over the medial aspect of the lower right hemithorax. No definite pneumothorax. Lung volumes are low. Bibasilar opacities favored to reflect areas of mild subsegmental atelectasis. No acute consolidative airspace disease. No pleural effusions. No evidence of pulmonary edema. Heart size is normal. Upper mediastinal contours are within normal limits. IMPRESSION: 1. Right-sided chest tube is stable in position. No appreciable pneumothorax. 2. Low lung volumes with bibasilar areas of subsegmental atelectasis. Electronically Signed   By: Vinnie Langton M.D.   On: 06/06/2019 08:44    Procedures Right chest tube placement, 27F pigtail - 06/02/19  Hospital Course:  49 yo F fall from horse. Does not remember  event.  Awake alert in ED with right sided chest pain. Workup showed right rib fractures 5-9 and pneumothorax. Chest tube placed and patient admitted for ongoing treatment and multimodal pain control. Pneumothorax resolved. On 5/6 chest tube was removed and follow up CXR was negative for recurrent pneumothorax. On 06/07/19 the patient was voiding well, tolerating diet, ambulating well, pain well controlled, vital signs stable, and felt stable for discharge home.  Patient will follow up in our office in 2 weeks as below and knows to call with questions or concerns.     Allergies as of 06/07/2019      Reactions   Codeine Nausea Only   Nausea   Lodine [etodolac] Nausea Only   Morphine And Related Nausea Only   Percocet [oxycodone-acetaminophen] Nausea Only      Medication List    TAKE these medications   acetaminophen 500 MG tablet Commonly known as: TYLENOL Take 2 tablets (1,000 mg total) by mouth every 6 (six) hours as needed for mild pain or headache.   LORazepam 1 MG tablet Commonly known as: ATIVAN Take 1 mg by mouth at bedtime as needed for sleep.   methocarbamol 500 MG tablet Commonly known as: ROBAXIN Take 2 tablets (1,000 mg total) by mouth every 6 (six) hours as needed for muscle spasms.   oxyCODONE 5 MG immediate release tablet Commonly known as: Oxy IR/ROXICODONE Take 1-2 tablets (5-10 mg total) by mouth every 6 (six) hours as needed for moderate pain or severe pain.   sertraline 50 MG tablet Commonly known as: ZOLOFT Take 75 mg by mouth daily.   traMADol 50 MG tablet Commonly known as: ULTRAM Take 1 tablet (50 mg total) by mouth every 6 (six) hours as needed for moderate pain.  Follow-up Information    [facility] [place]. Go on 06/21/2019.   Why: Follow up appointment scheduled for 9:40 AM. Please arrive 30 min prior to appointment time. Bring photo ID and insurance information. You will need to go to [place] 5/19 for chest x-ray.  Contact  information: [place] [phone] [place]. Go on 06/20/2019.   Why: Go for chest x-ray the day prior to trauma clinic appointment. If they have any issues finding order, please have them call [phone] Contact information: [place] [address] [id number]           Signed: Obie Dredge, [facility] 06/07/2019, 3:57 PM

## 2019-06-08 ENCOUNTER — Encounter (HOSPITAL_BASED_OUTPATIENT_CLINIC_OR_DEPARTMENT_OTHER): Payer: Self-pay | Admitting: Orthopedic Surgery

## 2019-06-20 ENCOUNTER — Other Ambulatory Visit: Payer: Self-pay | Admitting: General Surgery

## 2019-06-20 ENCOUNTER — Ambulatory Visit
Admission: RE | Admit: 2019-06-20 | Discharge: 2019-06-20 | Disposition: A | Discharge status: Home / Self Care | Payer: BC Managed Care – PPO | Source: Ambulatory Visit | Attending: General Surgery | Admitting: General Surgery

## 2019-06-20 DIAGNOSIS — J939 Pneumothorax, unspecified: Secondary | ICD-10-CM

## 2022-04-29 IMAGING — CT CT CERVICAL SPINE W/O CM
3 of 4 series · 13 of 33 positions shown, 16 images · non-contrast
Comparison: None.

CLINICAL DATA: 48-year-old female with trauma, thrown from horse

EXAM:
CT HEAD WITHOUT CONTRAST
CT CERVICAL SPINE WITHOUT CONTRAST
TECHNIQUE: Multidetector CT imaging of the head and cervical spine was
performed following the standard protocol without intravenous
contrast. Multiplanar CT image reconstructions of the cervical spine
were also generated.

[Series 4: c_spine 2.0 st · axial · 0.31mm/px · z∈[+202,+330]mm · 5 of 97 slices shown, 7 images]
[im 17/97  soft-tissue]
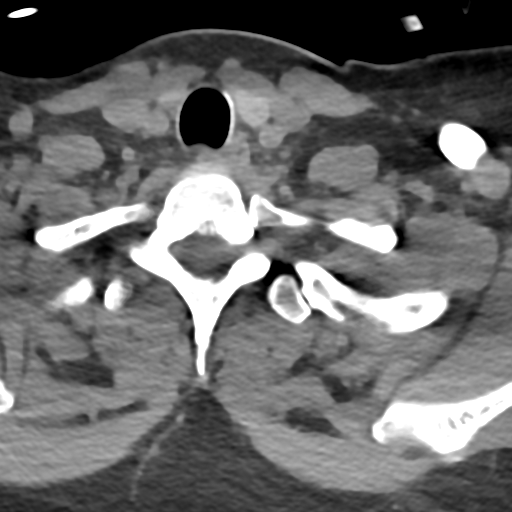
[im 17/97  bone]
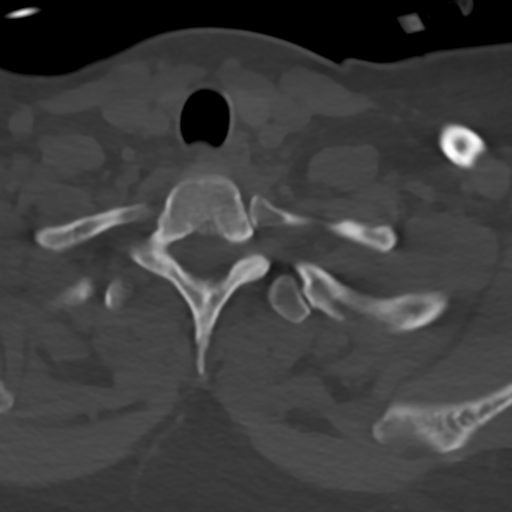
[im 33/97  bone]
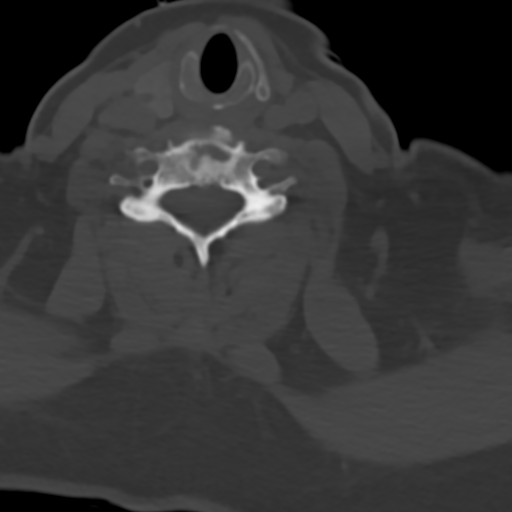
[im 49/97  bone]
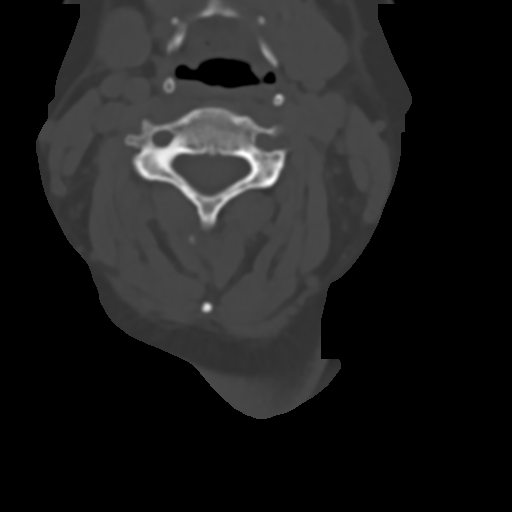
[im 65/97  bone]
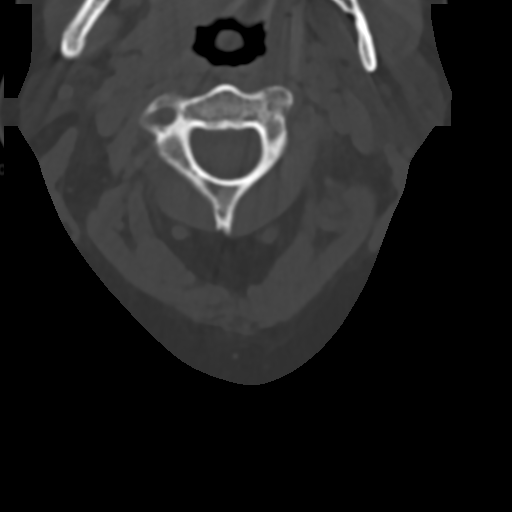
[im 81/97  soft-tissue]
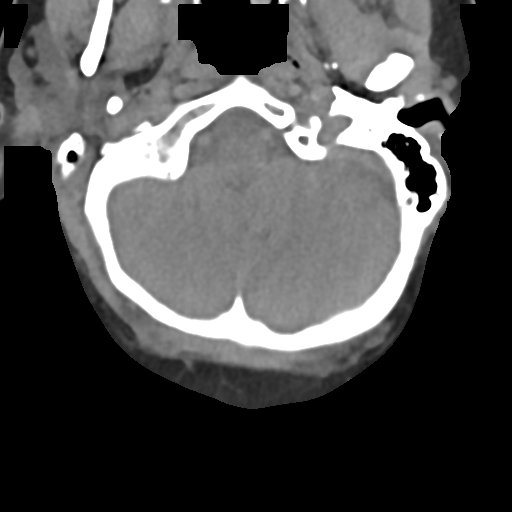
[im 81/97  bone]
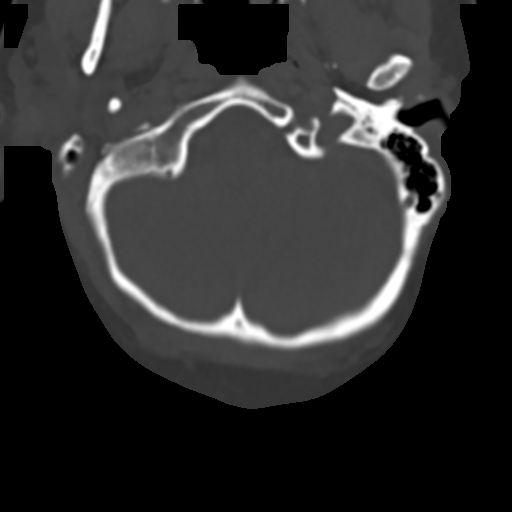

[Series 6: c_spine 2.0 sag bone · sagittal · 0.32mm/px · 5 of 51 slices shown, 6 images]
[im 17/51  bone]
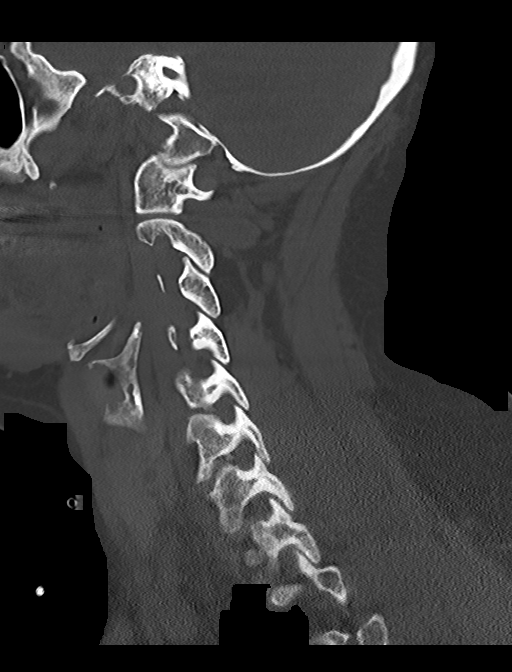
[im 21/51  bone]
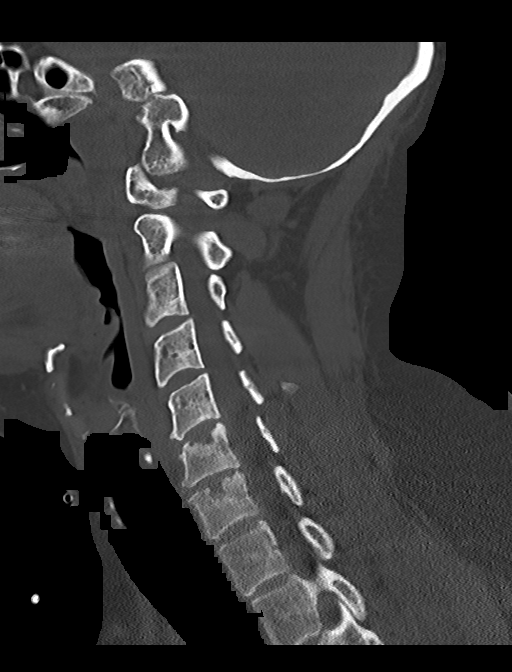
[im 26/51  soft-tissue]
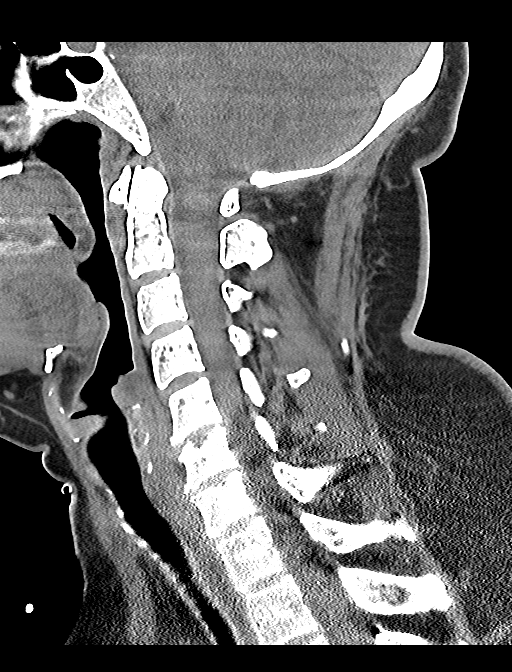
[im 26/51  bone]
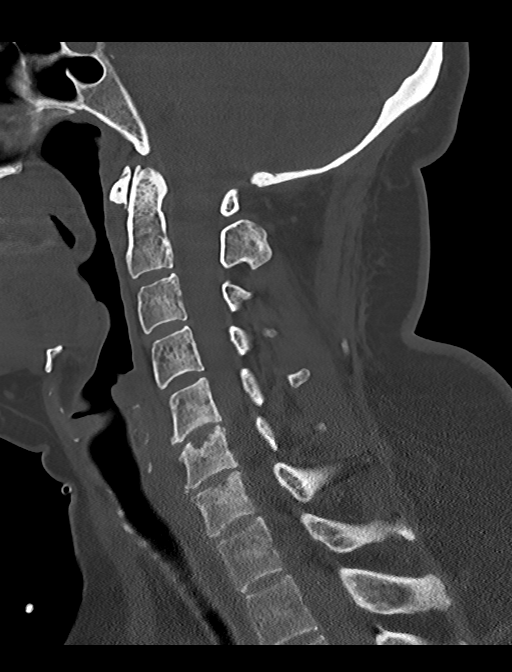
[im 30/51  bone]
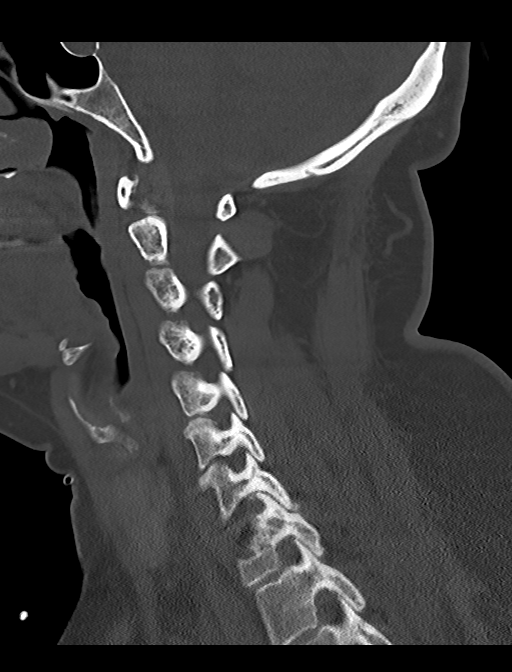
[im 34/51  bone]
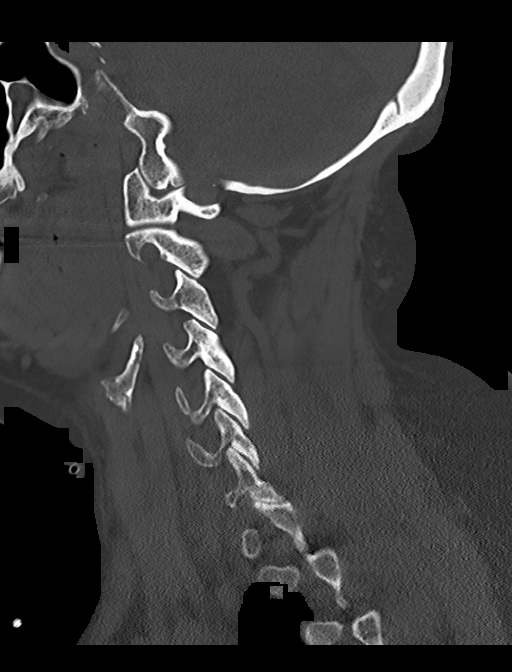

[Series 7: c_spine 2.0 cor bone · coronal · 0.34mm/px · 3 of 54 slices shown]
[im 11/54  bone]
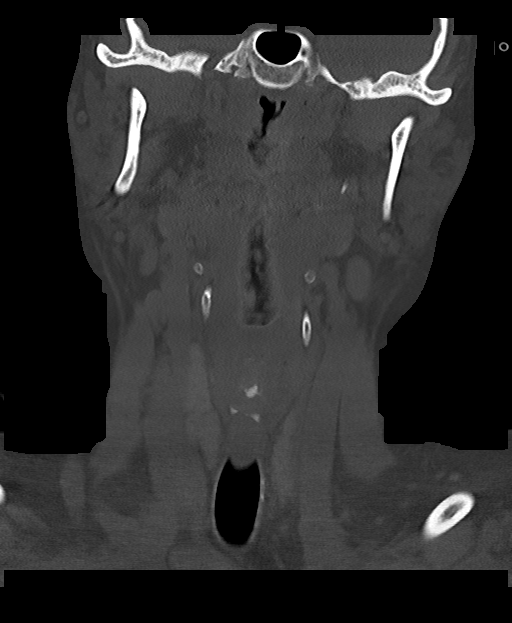
[im 22/54  bone]
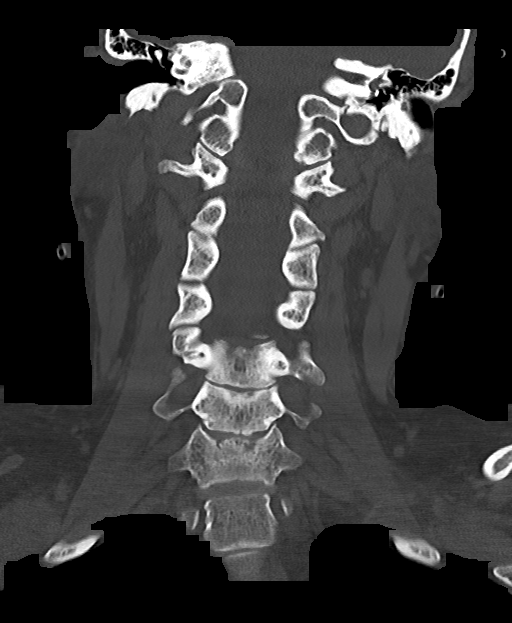
[im 32/54  bone]
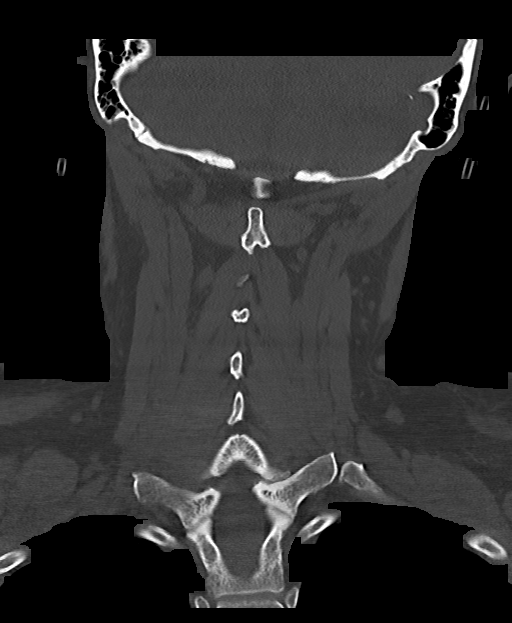

[13 of 33 positions shown; findings below may reference images not displayed]

FINDINGS: CT HEAD FINDINGS

Brain: No acute intracranial hemorrhage. No midline shift or mass
effect. Gray-white differentiation maintained. Unremarkable
appearance of the ventricular system.

Vascular: Unremarkable.

Skull: No acute fracture.  No aggressive bone lesion identified.

Sinuses/Orbits: Unremarkable appearance of the orbits. Mastoid air
cells clear. No middle ear effusion. No significant sinus disease.

Other: None

CT CERVICAL SPINE FINDINGS

Alignment: Craniocervical junction aligned. Anatomic alignment of
the cervical elements. No subluxation.

Skull base and vertebrae: No acute fracture at the skullbase.
Vertebral body heights relatively maintained. No acute fracture
identified.

Soft tissues and spinal canal: Unremarkable cervical soft tissues.
Lymph nodes are present, though not enlarged.

Disc levels: Irregular superior endplates of C6 and C7, chronic
though without comparison.

Anterior osteophyte production at C5-C6 and C6-C7.

No bony canal narrowing or foraminal narrowing.

Upper chest: Unremarkable appearance of the lung apices.

Other: Subcentimeter right-sided thyroid lesions do not require any
additional specific follow-up.
IMPRESSION: Head CT:

Negative for acute intracranial abnormality.

Cervical CT:

No acute fracture or malalignment of the cervical spine.

Early degenerative changes involving C5-C6 and C6-C7.
